# Patient Record
Sex: Female | Born: 1980 | Race: Black or African American | Hispanic: No | Marital: Single | Smoking: Current every day smoker
Health system: Southern US, Community
[De-identification: ages and names within clinical notes are randomized; demographics above are authoritative.]

## PROBLEM LIST (undated history)

## (undated) ENCOUNTER — Inpatient Hospital Stay (HOSPITAL_COMMUNITY): Payer: Self-pay

## (undated) DIAGNOSIS — Z789 Other specified health status: Secondary | ICD-10-CM

## (undated) HISTORY — PX: NO PAST SURGERIES: SHX2092

---

## 2003-05-17 ENCOUNTER — Other Ambulatory Visit: Admission: RE | Admit: 2003-05-17 | Discharge: 2003-05-17 | Payer: Self-pay | Admitting: Obstetrics and Gynecology

## 2003-05-17 ENCOUNTER — Other Ambulatory Visit: Admission: RE | Admit: 2003-05-17 | Discharge: 2003-05-17 | Payer: Self-pay | Admitting: *Deleted

## 2003-09-14 ENCOUNTER — Emergency Department (HOSPITAL_COMMUNITY): Admission: EM | Admit: 2003-09-14 | Discharge: 2003-09-14 | Payer: Self-pay | Admitting: Emergency Medicine

## 2004-12-11 ENCOUNTER — Emergency Department (HOSPITAL_COMMUNITY): Admission: EM | Admit: 2004-12-11 | Discharge: 2004-12-11 | Payer: Self-pay | Admitting: Emergency Medicine

## 2011-01-27 NOTE — L&D Delivery Note (Signed)
Delivery Note At 2:03 PM a viable female was delivered via  (Presentation: ;  ).  APGAR: , ; weight .   Placenta status: , .  Cord:  with the following complications: .  Cord pH: not done  Anesthesia:   Episiotomy:  Lacerations:  Suture Repair: 2.0 vicryl Est. Blood Loss (mL):   Mom to postpartum.  Baby to nursery-stable.  Felipe Cabell A 08/04/2011, 2:15 PM

## 2011-03-26 ENCOUNTER — Other Ambulatory Visit (HOSPITAL_COMMUNITY): Payer: Self-pay | Admitting: Obstetrics

## 2011-03-26 DIAGNOSIS — Z3689 Encounter for other specified antenatal screening: Secondary | ICD-10-CM

## 2011-03-31 ENCOUNTER — Encounter (HOSPITAL_COMMUNITY): Payer: Self-pay

## 2011-03-31 ENCOUNTER — Ambulatory Visit (HOSPITAL_COMMUNITY)
Admission: RE | Admit: 2011-03-31 | Discharge: 2011-03-31 | Disposition: A | Discharge status: Home / Self Care | Payer: Medicaid Other | Source: Ambulatory Visit | Attending: Obstetrics | Admitting: Obstetrics

## 2011-03-31 ENCOUNTER — Ambulatory Visit (HOSPITAL_COMMUNITY): Payer: Self-pay

## 2011-03-31 DIAGNOSIS — Z3689 Encounter for other specified antenatal screening: Secondary | ICD-10-CM

## 2011-03-31 DIAGNOSIS — O358XX0 Maternal care for other (suspected) fetal abnormality and damage, not applicable or unspecified: Secondary | ICD-10-CM | POA: Insufficient documentation

## 2011-03-31 DIAGNOSIS — Z363 Encounter for antenatal screening for malformations: Secondary | ICD-10-CM | POA: Insufficient documentation

## 2011-03-31 DIAGNOSIS — Z1389 Encounter for screening for other disorder: Secondary | ICD-10-CM | POA: Insufficient documentation

## 2011-04-02 ENCOUNTER — Other Ambulatory Visit (HOSPITAL_COMMUNITY): Payer: Self-pay | Admitting: Obstetrics

## 2011-04-02 DIAGNOSIS — Z3480 Encounter for supervision of other normal pregnancy, unspecified trimester: Secondary | ICD-10-CM

## 2011-04-14 ENCOUNTER — Ambulatory Visit (HOSPITAL_COMMUNITY)
Admission: RE | Admit: 2011-04-14 | Discharge: 2011-04-14 | Disposition: A | Discharge status: Home / Self Care | Payer: Medicaid Other | Source: Ambulatory Visit | Attending: Obstetrics | Admitting: Obstetrics

## 2011-04-14 DIAGNOSIS — Z3689 Encounter for other specified antenatal screening: Secondary | ICD-10-CM | POA: Insufficient documentation

## 2011-04-14 DIAGNOSIS — Z3480 Encounter for supervision of other normal pregnancy, unspecified trimester: Secondary | ICD-10-CM

## 2011-05-30 ENCOUNTER — Inpatient Hospital Stay (HOSPITAL_COMMUNITY)
Admission: AD | Admit: 2011-05-30 | Discharge: 2011-05-30 | Disposition: A | Discharge status: Home / Self Care | Payer: Medicaid Other | Source: Ambulatory Visit | Attending: Obstetrics | Admitting: Obstetrics

## 2011-05-30 ENCOUNTER — Encounter (HOSPITAL_COMMUNITY): Payer: Self-pay | Admitting: Obstetrics and Gynecology

## 2011-05-30 DIAGNOSIS — R109 Unspecified abdominal pain: Secondary | ICD-10-CM | POA: Insufficient documentation

## 2011-05-30 DIAGNOSIS — W1800XA Striking against unspecified object with subsequent fall, initial encounter: Secondary | ICD-10-CM

## 2011-05-30 DIAGNOSIS — O99891 Other specified diseases and conditions complicating pregnancy: Secondary | ICD-10-CM | POA: Insufficient documentation

## 2011-05-30 DIAGNOSIS — IMO0002 Reserved for concepts with insufficient information to code with codable children: Secondary | ICD-10-CM | POA: Insufficient documentation

## 2011-05-30 DIAGNOSIS — R1084 Generalized abdominal pain: Secondary | ICD-10-CM

## 2011-05-30 DIAGNOSIS — Y92009 Unspecified place in unspecified non-institutional (private) residence as the place of occurrence of the external cause: Secondary | ICD-10-CM | POA: Insufficient documentation

## 2011-05-30 HISTORY — DX: Other specified health status: Z78.9

## 2011-05-30 LAB — URINALYSIS, ROUTINE W REFLEX MICROSCOPIC
Bilirubin Urine: NEGATIVE
Ketones, ur: NEGATIVE mg/dL
Nitrite: NEGATIVE
Protein, ur: NEGATIVE mg/dL
Specific Gravity, Urine: 1.01 (ref 1.005–1.030)

## 2011-05-30 NOTE — ED Provider Notes (Signed)
First Provider Initiated Contact with Patient 05/30/11 1722      Chief Complaint:  Abdominal Pain and Fall Pt was letting her dog out last night, and the dog ran past her legs, causing her to hit her right side on the door.  She did not fall.  Her side was sore last night, less today, but "wanted to get it checked out"  Britny A Exton is  31 y.o. G1P0000 at [redacted]w[redacted]d presents complaining of Abdominal Pain and Fall .  She states none contractions are associated with none vaginal bleeding, intact membranes, along with active fetal movement.    Menstrual History: OB History    Grav Para Term Preterm Abortions TAB SAB Ect Mult Living   1 0 0 0 0 0 0 0 0 0        No LMP recorded. Patient is pregnant.     Past Medical History: Past Medical History  Diagnosis Date  . No pertinent past medical history     Past Surgical History: Past Surgical History  Procedure Date  . No past surgeries     Family History: Family History  Problem Relation Age of Onset  . Diabetes Father     Social History: History  Substance Use Topics  . Smoking status: Never Smoker   . Smokeless tobacco: Not on file  . Alcohol Use: No    Allergies: No Known Allergies  Meds:  Prescriptions prior to admission  Medication Sig Dispense Refill  . Prenatal Vit-Fe Fumarate-FA (PRENATAL MULTIVITAMIN) TABS Take 1 tablet by mouth daily.        Review of Systems - Please refer to the aforementioned patients' reports.     Physical Exam  Blood pressure 146/81, pulse 90, temperature 98.2 F (36.8 C), temperature source Oral, resp. rate 18. GENERAL: Well-developed, well-nourished female in no acute distress.  LUNGS: Clear to auscultation bilaterally.  HEART: Regular rate and rhythm. ABDOMEN: Soft, nontender, nondistended, gravid.   Area of soreness is on the right lateral side, at the level of the umbilicus EXTREMITIES: Nontender, no edema, 2+ distal pulses. CERVICAL EXAMdeferred FHT:  Baseline rate 150  bpm   Variability moderate  Accelerations present   Decelerations none Contractions: Every 0 mins   Labs: Results for orders placed during the hospital encounter of 05/30/11 (from the past 24 hour(s))  URINALYSIS, ROUTINE W REFLEX MICROSCOPIC     Status: Abnormal   Collection Time   05/30/11  5:37 PM      Component Value Range   Color, Urine YELLOW  YELLOW    APPearance HAZY (*) CLEAR    Specific Gravity, Urine 1.010  1.005 - 1.030    pH 7.5  5.0 - 8.0    Glucose, UA NEGATIVE  NEGATIVE (mg/dL)   Hgb urine dipstick NEGATIVE  NEGATIVE    Bilirubin Urine NEGATIVE  NEGATIVE    Ketones, ur NEGATIVE  NEGATIVE (mg/dL)   Protein, ur NEGATIVE  NEGATIVE (mg/dL)   Urobilinogen, UA 1.0  0.0 - 1.0 (mg/dL)   Nitrite NEGATIVE  NEGATIVE    Leukocytes, UA NEGATIVE  NEGATIVE     Imaging Studies:  No results found.  Assessment: Alphia A Ulysse is  31 y.o. G1P0000 at [redacted]w[redacted]d presents with abdominal soreness s/p hitting it on the side of a door;  Elevated B/P Plan: Dr. Gaynell Face given report, including vital signs, and orders to discharge pt.  CRESENZO-DISHMAN,Elchanan Bob 5/4/20135:42 PM

## 2011-05-30 NOTE — MAU Note (Signed)
Patient presents with c/o right side abdominal pain after taking a fall last night. Patient is [redacted] weeks gestation.

## 2011-05-30 NOTE — Discharge Instructions (Signed)
Fetal Movement Counts Patient Name: __________________________________________________ Patient Due Date: ____________________ Kick counts is highly recommended in high risk pregnancies, but it is a good idea for every pregnant woman to do. Start counting fetal movements at 28 weeks of the pregnancy. Fetal movements increase after eating a full meal or eating or drinking something sweet (the blood sugar is higher). It is also important to drink plenty of fluids (well hydrated) before doing the count. Lie on your left side because it helps with the circulation or you can sit in a comfortable chair with your arms over your belly (abdomen) with no distractions around you. DOING THE COUNT  Try to do the count the same time of day each time you do it.   Mark the day and time, then see how long it takes for you to feel 10 movements (kicks, flutters, swishes, rolls). You should have at least 10 movements within 2 hours. You will most likely feel 10 movements in much less than 2 hours. If you do not, wait an hour and count again. After a couple of days you will see a pattern.   What you are looking for is a change in the pattern or not enough counts in 2 hours. Is it taking longer in time to reach 10 movements?  SEEK MEDICAL CARE IF:  You feel less than 10 counts in 2 hours. Tried twice.   No movement in one hour.   The pattern is changing or taking longer each day to reach 10 counts in 2 hours.   You feel the baby is not moving as it usually does.  Date: ____________ Movements: ____________ Start time: ____________ Finish time: ____________  Date: ____________ Movements: ____________ Start time: ____________ Finish time: ____________ Date: ____________ Movements: ____________ Start time: ____________ Finish time: ____________ Date: ____________ Movements: ____________ Start time: ____________ Finish time: ____________ Date: ____________ Movements: ____________ Start time: ____________ Finish time:  ____________ Date: ____________ Movements: ____________ Start time: ____________ Finish time: ____________ Date: ____________ Movements: ____________ Start time: ____________ Finish time: ____________ Date: ____________ Movements: ____________ Start time: ____________ Finish time: ____________  Date: ____________ Movements: ____________ Start time: ____________ Finish time: ____________ Date: ____________ Movements: ____________ Start time: ____________ Finish time: ____________ Date: ____________ Movements: ____________ Start time: ____________ Finish time: ____________ Date: ____________ Movements: ____________ Start time: ____________ Finish time: ____________ Date: ____________ Movements: ____________ Start time: ____________ Finish time: ____________ Date: ____________ Movements: ____________ Start time: ____________ Finish time: ____________ Date: ____________ Movements: ____________ Start time: ____________ Finish time: ____________  Date: ____________ Movements: ____________ Start time: ____________ Finish time: ____________ Date: ____________ Movements: ____________ Start time: ____________ Finish time: ____________ Date: ____________ Movements: ____________ Start time: ____________ Finish time: ____________ Date: ____________ Movements: ____________ Start time: ____________ Finish time: ____________ Date: ____________ Movements: ____________ Start time: ____________ Finish time: ____________ Date: ____________ Movements: ____________ Start time: ____________ Finish time: ____________ Date: ____________ Movements: ____________ Start time: ____________ Finish time: ____________  Date: ____________ Movements: ____________ Start time: ____________ Finish time: ____________ Date: ____________ Movements: ____________ Start time: ____________ Finish time: ____________ Date: ____________ Movements: ____________ Start time: ____________ Finish time: ____________ Date: ____________ Movements:  ____________ Start time: ____________ Finish time: ____________ Date: ____________ Movements: ____________ Start time: ____________ Finish time: ____________ Date: ____________ Movements: ____________ Start time: ____________ Finish time: ____________ Date: ____________ Movements: ____________ Start time: ____________ Finish time: ____________  Date: ____________ Movements: ____________ Start time: ____________ Finish time: ____________ Date: ____________ Movements: ____________ Start time: ____________ Finish time: ____________ Date: ____________ Movements: ____________ Start time:   ____________ Finish time: ____________ Date: ____________ Movements: ____________ Start time: ____________ Finish time: ____________ Date: ____________ Movements: ____________ Start time: ____________ Finish time: ____________ Date: ____________ Movements: ____________ Start time: ____________ Finish time: ____________ Date: ____________ Movements: ____________ Start time: ____________ Finish time: ____________  Date: ____________ Movements: ____________ Start time: ____________ Finish time: ____________ Date: ____________ Movements: ____________ Start time: ____________ Finish time: ____________ Date: ____________ Movements: ____________ Start time: ____________ Finish time: ____________ Date: ____________ Movements: ____________ Start time: ____________ Finish time: ____________ Date: ____________ Movements: ____________ Start time: ____________ Finish time: ____________ Date: ____________ Movements: ____________ Start time: ____________ Finish time: ____________ Date: ____________ Movements: ____________ Start time: ____________ Finish time: ____________  Date: ____________ Movements: ____________ Start time: ____________ Finish time: ____________ Date: ____________ Movements: ____________ Start time: ____________ Finish time: ____________ Date: ____________ Movements: ____________ Start time: ____________ Finish  time: ____________ Date: ____________ Movements: ____________ Start time: ____________ Finish time: ____________ Date: ____________ Movements: ____________ Start time: ____________ Finish time: ____________ Date: ____________ Movements: ____________ Start time: ____________ Finish time: ____________ Date: ____________ Movements: ____________ Start time: ____________ Finish time: ____________  Date: ____________ Movements: ____________ Start time: ____________ Finish time: ____________ Date: ____________ Movements: ____________ Start time: ____________ Finish time: ____________ Date: ____________ Movements: ____________ Start time: ____________ Finish time: ____________ Date: ____________ Movements: ____________ Start time: ____________ Finish time: ____________ Date: ____________ Movements: ____________ Start time: ____________ Finish time: ____________ Date: ____________ Movements: ____________ Start time: ____________ Finish time: ____________ Document Released: 02/11/2006 Document Revised: 01/01/2011 Document Reviewed: 08/14/2008 [employer] Patient Information 2012 [employer]. 

## 2011-08-03 ENCOUNTER — Encounter (HOSPITAL_COMMUNITY): Payer: Self-pay | Admitting: *Deleted

## 2011-08-03 ENCOUNTER — Inpatient Hospital Stay (HOSPITAL_COMMUNITY)
Admission: AD | Admit: 2011-08-03 | Discharge: 2011-08-06 | DRG: 775 | Disposition: A | Discharge status: Home / Self Care | Payer: Medicaid Other | Source: Ambulatory Visit | Attending: Obstetrics | Admitting: Obstetrics

## 2011-08-03 NOTE — MAU Note (Signed)
PT SAY AT 945PM SHE FELT A POP-  WENT TO B-ROOM - THINKS SROM- CLEAR FLUID.   DR MARSHALL- OFFICE LAST WEEK- CLOSED.

## 2011-08-03 NOTE — MAU Note (Signed)
UC STARTED AT 10PM

## 2011-08-04 ENCOUNTER — Encounter (HOSPITAL_COMMUNITY): Payer: Self-pay | Admitting: Anesthesiology

## 2011-08-04 ENCOUNTER — Encounter (HOSPITAL_COMMUNITY): Payer: Self-pay | Admitting: *Deleted

## 2011-08-04 ENCOUNTER — Inpatient Hospital Stay (HOSPITAL_COMMUNITY): Payer: Medicaid Other | Admitting: Anesthesiology

## 2011-08-04 LAB — OB RESULTS CONSOLE HEPATITIS B SURFACE ANTIGEN: Hepatitis B Surface Ag: NEGATIVE

## 2011-08-04 LAB — CBC
MCH: 32.5 pg (ref 26.0–34.0)
MCHC: 34.1 g/dL (ref 30.0–36.0)
Platelets: 399 10*3/uL (ref 150–400)
WBC: 9.6 10*3/uL (ref 4.0–10.5)

## 2011-08-04 LAB — OB RESULTS CONSOLE HIV ANTIBODY (ROUTINE TESTING): HIV: NONREACTIVE

## 2011-08-04 LAB — OB RESULTS CONSOLE GC/CHLAMYDIA: Chlamydia: NEGATIVE

## 2011-08-04 LAB — OB RESULTS CONSOLE RPR: RPR: NONREACTIVE

## 2011-08-04 MED ORDER — ONDANSETRON HCL 4 MG/2ML IJ SOLN: 4.0000 mg | INTRAMUSCULAR | Status: DC | PRN

## 2011-08-04 MED ORDER — LANOLIN HYDROUS EX OINT: TOPICAL_OINTMENT | CUTANEOUS | Status: DC | PRN

## 2011-08-04 MED ORDER — BENZOCAINE-MENTHOL 20-0.5 % EX AERO
1.0000 "application " | INHALATION_SPRAY | CUTANEOUS | Status: DC | PRN
  Administered 2011-08-04: 1 via TOPICAL
  Filled 2011-08-04: qty 56

## 2011-08-04 MED ORDER — LACTATED RINGERS IV SOLN: 500.0000 mL | INTRAVENOUS | Status: DC | PRN

## 2011-08-04 MED ORDER — ONDANSETRON HCL 4 MG PO TABS: 4.0000 mg | ORAL_TABLET | ORAL | Status: DC | PRN

## 2011-08-04 MED ORDER — IBUPROFEN 600 MG PO TABS
600.0000 mg | ORAL_TABLET | Freq: Four times a day (QID) | ORAL | Status: DC | PRN
  Administered 2011-08-04: 600 mg via ORAL
  Filled 2011-08-04 (×6): qty 1

## 2011-08-04 MED ORDER — ACETAMINOPHEN 325 MG PO TABS: 650.0000 mg | ORAL_TABLET | ORAL | Status: DC | PRN

## 2011-08-04 MED ORDER — LACTATED RINGERS IV SOLN
INTRAVENOUS | Status: DC
  Administered 2011-08-04 (×3): via INTRAVENOUS

## 2011-08-04 MED ORDER — LACTATED RINGERS IV SOLN: 500.0000 mL | Freq: Once | INTRAVENOUS | Status: DC

## 2011-08-04 MED ORDER — EPHEDRINE 5 MG/ML INJ
10.0000 mg | INTRAVENOUS | Status: DC | PRN
  Filled 2011-08-04: qty 2
  Filled 2011-08-04: qty 4

## 2011-08-04 MED ORDER — PRENATAL MULTIVITAMIN CH
1.0000 | ORAL_TABLET | Freq: Every day | ORAL | Status: DC
  Administered 2011-08-05 – 2011-08-06 (×2): 1 via ORAL
  Filled 2011-08-04 (×2): qty 1

## 2011-08-04 MED ORDER — TERBUTALINE SULFATE 1 MG/ML IJ SOLN
INTRAMUSCULAR | Status: AC
  Filled 2011-08-04: qty 1

## 2011-08-04 MED ORDER — EPHEDRINE 5 MG/ML INJ
10.0000 mg | INTRAVENOUS | Status: DC | PRN
  Filled 2011-08-04: qty 2

## 2011-08-04 MED ORDER — WITCH HAZEL-GLYCERIN EX PADS: 1.0000 "application " | MEDICATED_PAD | CUTANEOUS | Status: DC | PRN

## 2011-08-04 MED ORDER — PHENYLEPHRINE 40 MCG/ML (10ML) SYRINGE FOR IV PUSH (FOR BLOOD PRESSURE SUPPORT)
80.0000 ug | PREFILLED_SYRINGE | INTRAVENOUS | Status: DC | PRN
  Filled 2011-08-04: qty 2

## 2011-08-04 MED ORDER — IBUPROFEN 600 MG PO TABS
600.0000 mg | ORAL_TABLET | Freq: Four times a day (QID) | ORAL | Status: DC
  Administered 2011-08-05 – 2011-08-06 (×7): 600 mg via ORAL
  Filled 2011-08-04 (×2): qty 1

## 2011-08-04 MED ORDER — OXYCODONE-ACETAMINOPHEN 5-325 MG PO TABS
1.0000 | ORAL_TABLET | ORAL | Status: DC | PRN
  Administered 2011-08-05 (×2): 1 via ORAL
  Filled 2011-08-04 (×2): qty 2

## 2011-08-04 MED ORDER — LACTATED RINGERS IV SOLN
INTRAVENOUS | Status: DC
  Administered 2011-08-04: 10:00:00 via INTRAUTERINE

## 2011-08-04 MED ORDER — LIDOCAINE HCL (PF) 1 % IJ SOLN
30.0000 mL | INTRAMUSCULAR | Status: DC | PRN
  Filled 2011-08-04 (×2): qty 30

## 2011-08-04 MED ORDER — DIPHENHYDRAMINE HCL 50 MG/ML IJ SOLN: 12.5000 mg | INTRAMUSCULAR | Status: DC | PRN

## 2011-08-04 MED ORDER — DIPHENHYDRAMINE HCL 25 MG PO CAPS: 25.0000 mg | ORAL_CAPSULE | Freq: Four times a day (QID) | ORAL | Status: DC | PRN

## 2011-08-04 MED ORDER — FLEET ENEMA 7-19 GM/118ML RE ENEM: 1.0000 | ENEMA | RECTAL | Status: DC | PRN

## 2011-08-04 MED ORDER — CITRIC ACID-SODIUM CITRATE 334-500 MG/5ML PO SOLN
30.0000 mL | ORAL | Status: DC | PRN
  Filled 2011-08-04: qty 15

## 2011-08-04 MED ORDER — SENNOSIDES-DOCUSATE SODIUM 8.6-50 MG PO TABS
2.0000 | ORAL_TABLET | Freq: Every day | ORAL | Status: DC
  Administered 2011-08-04 – 2011-08-05 (×2): 2 via ORAL

## 2011-08-04 MED ORDER — DIBUCAINE 1 % RE OINT: 1.0000 "application " | TOPICAL_OINTMENT | RECTAL | Status: DC | PRN

## 2011-08-04 MED ORDER — OXYCODONE-ACETAMINOPHEN 5-325 MG PO TABS
1.0000 | ORAL_TABLET | ORAL | Status: DC | PRN
  Administered 2011-08-04 – 2011-08-05 (×4): 2 via ORAL
  Administered 2011-08-06: 1 via ORAL
  Administered 2011-08-06: 2 via ORAL
  Filled 2011-08-04: qty 1
  Filled 2011-08-04 (×2): qty 2
  Filled 2011-08-04: qty 1
  Filled 2011-08-04: qty 2
  Filled 2011-08-04: qty 1

## 2011-08-04 MED ORDER — OXYTOCIN 40 UNITS IN LACTATED RINGERS INFUSION - SIMPLE MED
62.5000 mL/h | Freq: Once | INTRAVENOUS | Status: DC
  Filled 2011-08-04: qty 1000

## 2011-08-04 MED ORDER — ZOLPIDEM TARTRATE 5 MG PO TABS: 5.0000 mg | ORAL_TABLET | Freq: Every evening | ORAL | Status: DC | PRN

## 2011-08-04 MED ORDER — LIDOCAINE HCL (PF) 1 % IJ SOLN
INTRAMUSCULAR | Status: DC | PRN
  Administered 2011-08-04: 30 mL via VAGINAL
  Administered 2011-08-04 (×2): 5 mL

## 2011-08-04 MED ORDER — FERROUS SULFATE 325 (65 FE) MG PO TABS
325.0000 mg | ORAL_TABLET | Freq: Two times a day (BID) | ORAL | Status: DC
  Administered 2011-08-05 – 2011-08-06 (×3): 325 mg via ORAL
  Filled 2011-08-04 (×3): qty 1

## 2011-08-04 MED ORDER — TETANUS-DIPHTH-ACELL PERTUSSIS 5-2.5-18.5 LF-MCG/0.5 IM SUSP
0.5000 mL | Freq: Once | INTRAMUSCULAR | Status: AC
  Administered 2011-08-05: 0.5 mL via INTRAMUSCULAR

## 2011-08-04 MED ORDER — OXYTOCIN BOLUS FROM INFUSION
250.0000 mL | Freq: Once | INTRAVENOUS | Status: DC
  Filled 2011-08-04: qty 500

## 2011-08-04 MED ORDER — SIMETHICONE 80 MG PO CHEW: 80.0000 mg | CHEWABLE_TABLET | ORAL | Status: DC | PRN

## 2011-08-04 MED ORDER — ONDANSETRON HCL 4 MG/2ML IJ SOLN: 4.0000 mg | Freq: Four times a day (QID) | INTRAMUSCULAR | Status: DC | PRN

## 2011-08-04 MED ORDER — PHENYLEPHRINE 40 MCG/ML (10ML) SYRINGE FOR IV PUSH (FOR BLOOD PRESSURE SUPPORT)
80.0000 ug | PREFILLED_SYRINGE | INTRAVENOUS | Status: DC | PRN
  Filled 2011-08-04: qty 5
  Filled 2011-08-04: qty 2

## 2011-08-04 MED ORDER — FENTANYL 2.5 MCG/ML BUPIVACAINE 1/10 % EPIDURAL INFUSION (WH - ANES)
14.0000 mL/h | INTRAMUSCULAR | Status: DC
  Administered 2011-08-04 (×4): 14 mL/h via EPIDURAL
  Filled 2011-08-04 (×4): qty 60

## 2011-08-04 NOTE — Anesthesia Preprocedure Evaluation (Signed)
Anesthesia Evaluation  Patient identified by MRN, date of birth, ID band Patient awake    Reviewed: Allergy & Precautions, H&P , Patient's Chart, lab work & pertinent test results  Airway Mallampati: III TM Distance: >3 FB Neck ROM: full    Dental No notable dental hx.    Pulmonary neg pulmonary ROS,  breath sounds clear to auscultation  Pulmonary exam normal       Cardiovascular negative cardio ROS  Rhythm:regular Rate:Normal     Neuro/Psych negative neurological ROS  negative psych ROS   GI/Hepatic negative GI ROS, Neg liver ROS,   Endo/Other  negative endocrine ROS  Renal/GU negative Renal ROS     Musculoskeletal   Abdominal   Peds  Hematology negative hematology ROS (+)   Anesthesia Other Findings   Reproductive/Obstetrics (+) Pregnancy                           Anesthesia Physical Anesthesia Plan  ASA: II  Anesthesia Plan: Epidural   Post-op Pain Management:    Induction:   Airway Management Planned:   Additional Equipment:   Intra-op Plan:   Post-operative Plan:   Informed Consent: I have reviewed the patients History and Physical, chart, labs and discussed the procedure including the risks, benefits and alternatives for the proposed anesthesia with the patient or authorized representative who has indicated his/her understanding and acceptance.     Plan Discussed with:   Anesthesia Plan Comments:         Anesthesia Quick Evaluation  

## 2011-08-04 NOTE — H&P (Signed)
Alicia Perry is a 31 y.o. female presenting for SROM and UC's.. Maternal Medical History:  Reason for admission: Reason for admission: rupture of membranes and contractions.  Contractions: Onset was 6-12 hours ago.   Frequency: regular.   Perceived severity is strong.    Fetal activity: Perceived fetal activity is normal.   Last perceived fetal movement was within the past hour.    Prenatal complications: no prenatal complications   OB History    Grav Para Term Preterm Abortions TAB SAB Ect Mult Living   2 0 0 0 1 0 0 0 0 0      Past Medical History  Diagnosis Date  . No pertinent past medical history    Past Surgical History  Procedure Date  . No past surgeries    Family History: family history includes Diabetes in her father. Social History:  reports that she has never smoked. She does not have any smokeless tobacco history on file. She reports that she does not drink alcohol or use illicit drugs.   Prenatal Transfer Tool  Maternal Diabetes: No Genetic Screening: Normal Maternal Ultrasounds/Referrals: Normal Fetal Ultrasounds or other Referrals:  None Maternal Substance Abuse:  No Significant Maternal Medications:  Meds include: Other: see prenatal record Significant Maternal Lab Results:  Lab values include: Group B Strep negative Other Comments:  None  Review of Systems  All other systems reviewed and are negative.    Dilation: 3.5 Effacement (%): 80 Station: -2 Exam by:: Penley, RN  Blood pressure 147/83, pulse 88, temperature 98.1 F (36.7 C), temperature source Oral, resp. rate 18, height 5\' 4"  (1.626 m), weight 104.101 kg (229 lb 8 oz), SpO2 100.00%. Maternal Exam:  Uterine Assessment: Contraction strength is firm.  Contraction frequency is regular.   Abdomen: Patient reports no abdominal tenderness. Fetal presentation: vertex  Introitus: Normal vulva. Normal vagina.  Pelvis: adequate for delivery.   Cervix: Cervix evaluated by digital exam.      Physical Exam  Nursing note and vitals reviewed. Constitutional: She is oriented to person, place, and time. She appears well-developed and well-nourished.  HENT:  Head: Normocephalic and atraumatic.  Eyes: Conjunctivae are normal. Pupils are equal, round, and reactive to light.  Neck: Normal range of motion. Neck supple.  Cardiovascular: Normal rate and regular rhythm.   Respiratory: Effort normal.  GI: Soft.  Genitourinary: Vagina normal and uterus normal.  Musculoskeletal: Normal range of motion.  Neurological: She is alert and oriented to person, place, and time.  Skin: Skin is warm and dry.  Psychiatric: She has a normal mood and affect. Her behavior is normal. Judgment and thought content normal.    Prenatal labs: ABO, Rh: B/Positive/-- (07/09 0000) Antibody: Negative (07/09 0000) Rubella: Immune (07/09 0000) RPR: Nonreactive (07/09 0000)  HBsAg: Negative (07/09 0000)  HIV: Non-reactive (07/09 0000)  GBS: Negative (07/09 0000)   Assessment/Plan: 31 yo G2 P0  EDC 08-11-11.  Early labor.  Meconium.  Occiput posterior.  Start amnioinfusion.  Pitocin prn.   HARPER,CHARLES A 08/04/2011, 5:39 AM

## 2011-08-04 NOTE — Anesthesia Procedure Notes (Signed)
Epidural Patient location during procedure: OB Start time: 08/04/2011 1:19 AM  Staffing Anesthesiologist: Brayton Caves R Performed by: anesthesiologist   Preanesthetic Checklist Completed: patient identified, site marked, surgical consent, pre-op evaluation, timeout performed, IV checked, risks and benefits discussed and monitors and equipment checked  Epidural Patient position: sitting Prep: site prepped and draped and DuraPrep Patient monitoring: continuous pulse ox and blood pressure Approach: midline Injection technique: LOR air and LOR saline  Needle:  Needle type: Tuohy  Needle gauge: 17 G Needle length: 9 cm Needle insertion depth: 6 cm Catheter type: closed end flexible Catheter size: 19 Gauge Catheter at skin depth: 11 cm Test dose: negative  Assessment Events: blood not aspirated, injection not painful, no injection resistance, negative IV test and no paresthesia  Additional Notes Patient identified.  Risk benefits discussed including failed block, incomplete pain control, headache, nerve damage, paralysis, blood pressure changes, nausea, vomiting, reactions to medication both toxic or allergic, and postpartum back pain.  Patient expressed understanding and wished to proceed.  All questions were answered.  Sterile technique used throughout procedure and epidural site dressed with sterile barrier dressing. No paresthesia or other complications noted.The patient did not experience any signs of intravascular injection such as tinnitus or metallic taste in mouth nor signs of intrathecal spread such as rapid motor block. Please see nursing notes for vital signs.

## 2011-08-04 NOTE — Progress Notes (Signed)
Dianelly A Jocelyn is a 31 y.o. G2P0010 at [redacted]w[redacted]d by LMP admitted for rupture of membranes  Subjective:   Objective: BP 147/83  Pulse 88  Temp 98.1 F (36.7 C) (Oral)  Resp 18  Ht 5\' 4"  (1.626 m)  Wt 104.101 kg (229 lb 8 oz)  BMI 39.39 kg/m2  SpO2 100%   Total I/O In: -  Out: 650 [Urine:650]  FHT:  FHR: 150 bpm, variability: moderate,  accelerations:  Present,  decelerations:  Present variables UC:   regular, every 2-4 minutes SVE:   Dilation: 3.5 Effacement (%): 80 Station: -2 Exam by:: [employer], RN   Labs: Lab Results  Component Value Date   WBC 9.6 08/03/2011   HGB 12.3 08/03/2011   HCT 36.1 08/03/2011   MCV 95.3 08/03/2011   PLT 399 08/03/2011    Assessment / Plan: Spontaneous labor, progressing normally  Labor: Progressing normally Preeclampsia:  n/a Fetal Wellbeing:  Category I Pain Control:  Epidural I/D:  n/a Anticipated MOD:  NSVD  Ariauna Farabee A 08/04/2011, 5:48 AM

## 2011-08-04 NOTE — MAU Provider Note (Signed)
S: 31 y.o.G1P0000 @[redacted]w[redacted]d  presents to MAU with LOF and contractions.  O: BP 142/74  Pulse 98  Temp 98.7 F (37.1 C) (Oral)  Resp 20  Ht 5\' 4"  (1.626 m)  Wt 104.101 kg (229 lb 8 oz)  BMI 39.39 kg/m2  Pelvic exam: Pooling of clear fluid mixed with bright red blood in vagina Dilation: 2 Effacement (%): 80 Station: -2 Presentation: Vertex Exam by:: Vivien Presto CNM  Heavy meconium on glove and leaking onto bed under pt following cervical exam  A: SROM Early labor  P: RN to call Dr Clearance Coots Admission to birthing suites  LEFTWICH-KIRBY, Davinity Fanara Certified Nurse-Midwife

## 2011-08-05 ENCOUNTER — Encounter (HOSPITAL_COMMUNITY): Payer: Self-pay | Admitting: *Deleted

## 2011-08-05 LAB — CBC
HCT: 30 % — ABNORMAL LOW (ref 36.0–46.0)
MCHC: 35 g/dL (ref 30.0–36.0)
MCV: 96.8 fL (ref 78.0–100.0)
RDW: 12.4 % (ref 11.5–15.5)
WBC: 18.6 10*3/uL — ABNORMAL HIGH (ref 4.0–10.5)

## 2011-08-05 NOTE — Progress Notes (Signed)
Patient ID: Alicia Perry, female   DOB: Jun 01, 1980, 31 y.o.   MRN: [id number] Postpartum day one Vital signs normal Fundus firm Lochia moderate Legs negative No complaints

## 2011-08-05 NOTE — Progress Notes (Signed)
UR chart review completed.  

## 2011-08-06 NOTE — Discharge Summary (Signed)
Obstetric Discharge Summary Reason for Admission: onset of labor Prenatal Procedures: none Intrapartum Procedures: spontaneous vaginal delivery Postpartum Procedures: none Complications-Operative and Postpartum: none Hemoglobin  Date Value Range Status  08/05/2011 10.5* 12.0 - 15.0 g/dL Final     HCT  Date Value Range Status  08/05/2011 30.0* 36.0 - 46.0 % Final    Physical Exam:  General: alert Lochia: appropriate Uterine Fundus: firm Incision: healing well DVT Evaluation: No evidence of DVT seen on physical exam.  Discharge Diagnoses: Term Pregnancy-delivered  Discharge Information: Date: 08/06/2011 Activity: pelvic rest Diet: routine Medications: Percocet Condition: stable Instructions: refer to practice specific booklet Discharge to: home Follow-up Information    Follow up with Jodee Wagenaar A, MD. Schedule an appointment as soon as possible for a visit in 6 weeks.   Contact information:   [address] [place] [address] [phone]          Newborn Data: Live born female  Birth Weight: 6 lb 11.2 oz (3040 g) APGAR: 6, 8  Home with mother.  Asa Fath A 08/06/2011, 6:44 AM

## 2011-08-07 NOTE — Anesthesia Postprocedure Evaluation (Signed)
Anesthesia Post Note  Patient: Alicia Perry  Procedure(s) Performed: * No procedures listed *  Anesthesia type: Epidural  Patient location: Mother/Baby  Post pain: Pain level controlled  Post assessment: Post-op Vital signs reviewed  Last Vitals: There were no vitals filed for this visit.  Post vital signs: Reviewed  Level of consciousness: awake  Complications: No apparent anesthesia complications

## 2012-07-13 ENCOUNTER — Other Ambulatory Visit: Payer: Self-pay

## 2012-07-13 ENCOUNTER — Encounter (HOSPITAL_COMMUNITY): Payer: Self-pay

## 2012-07-13 ENCOUNTER — Emergency Department (INDEPENDENT_AMBULATORY_CARE_PROVIDER_SITE_OTHER)
Admission: EM | Admit: 2012-07-13 | Discharge: 2012-07-13 | Disposition: A | Discharge status: Home / Self Care | Payer: Self-pay | Source: Home / Self Care

## 2012-07-13 DIAGNOSIS — M940 Chondrocostal junction syndrome [Tietze]: Secondary | ICD-10-CM

## 2012-07-13 MED ORDER — HYDROCOD POLST-CHLORPHEN POLST 10-8 MG/5ML PO LQCR: 5.0000 mL | Freq: Two times a day (BID) | ORAL | Status: DC | PRN

## 2012-07-13 MED ORDER — DICLOFENAC POTASSIUM 50 MG PO TABS: 50.0000 mg | ORAL_TABLET | Freq: Three times a day (TID) | ORAL | Status: DC

## 2012-07-13 NOTE — ED Provider Notes (Signed)
History     CSN: [id number]  Arrival date & time 07/13/12  1847   None     Chief Complaint  Patient presents with  . Chest Pain    (Consider location/radiation/quality/duration/timing/severity/associated sxs/prior treatment) Patient is a 32 y.o. female presenting with chest pain. The history is provided by the patient.  Chest Pain Pain location:  L chest Pain quality: sharp   Pain radiates to:  Upper back Pain radiates to the back: yes   Pain severity:  Mild Onset quality:  Gradual Duration:  1 week Timing:  Constant Progression:  Waxing and waning Chronicity:  New Context: movement   Associated symptoms: cough   Associated symptoms: no palpitations and no shortness of breath   Risk factors: smoking     Past Medical History  Diagnosis Date  . No pertinent past medical history     Past Surgical History  Procedure Laterality Date  . No past surgeries      Family History  Problem Relation Age of Onset  . Diabetes Father     History  Substance Use Topics  . Smoking status: Never Smoker   . Smokeless tobacco: Not on file  . Alcohol Use: No    OB History   Grav Para Term Preterm Abortions TAB SAB Ect Mult Living   2 1 1  0 1 0 0 0 0 1      Review of Systems  Constitutional: Negative.   Respiratory: Positive for cough. Negative for chest tightness, shortness of breath and wheezing.   Cardiovascular: Positive for chest pain. Negative for palpitations and leg swelling.  Gastrointestinal: Negative.     Allergies  Review of patient's allergies indicates no known allergies.  Home Medications   Current Outpatient Rx  Name  Route  Sig  Dispense  Refill  . chlorpheniramine-HYDROcodone (TUSSIONEX PENNKINETIC ER) 10-8 MG/5ML LQCR   Oral   Take 5 mLs by mouth every 12 (twelve) hours as needed.   115 mL   0   . diclofenac (CATAFLAM) 50 MG tablet   Oral   Take 1 tablet (50 mg total) by mouth 3 (three) times daily.   30 tablet   0     BP 147/97   Pulse 103  Temp(Src) 98.1 F (36.7 C) (Oral)  Resp 16  SpO2 100%  LMP 06/20/2012  Physical Exam  Nursing note and vitals reviewed. Constitutional: She is oriented to person, place, and time. She appears well-developed and well-nourished. No distress.  Neck: Normal range of motion. Neck supple.  Cardiovascular: Normal rate, regular rhythm, normal heart sounds and intact distal pulses.   Pulmonary/Chest: Effort normal and breath sounds normal. She exhibits tenderness.    Abdominal: Soft. Bowel sounds are normal.  Lymphadenopathy:    She has no cervical adenopathy.  Neurological: She is alert and oriented to person, place, and time.  Skin: Skin is warm and dry.    ED Course  Procedures (including critical care time)  Labs Reviewed - No data to display No results found.   1. Acute costochondritis       MDM  [id number]--wnl.        Linna Hoff, MD 07/13/12 [phone]

## 2012-07-13 NOTE — ED Notes (Signed)
States he has been having pain in left and mid chest through to shoulder  for 1 week, used motrin Saturday w some relief. States no change in pain w deep breathing or ROM . Recent cough. NAD at present

## 2012-12-16 ENCOUNTER — Emergency Department (INDEPENDENT_AMBULATORY_CARE_PROVIDER_SITE_OTHER)
Admission: EM | Admit: 2012-12-16 | Discharge: 2012-12-16 | Disposition: A | Discharge status: Home / Self Care | Payer: Self-pay | Source: Home / Self Care | Attending: Family Medicine | Admitting: Family Medicine

## 2012-12-16 ENCOUNTER — Encounter (HOSPITAL_COMMUNITY): Payer: Self-pay | Admitting: Emergency Medicine

## 2012-12-16 DIAGNOSIS — J069 Acute upper respiratory infection, unspecified: Secondary | ICD-10-CM

## 2012-12-16 DIAGNOSIS — J00 Acute nasopharyngitis [common cold]: Secondary | ICD-10-CM

## 2012-12-16 MED ORDER — METHYLPREDNISOLONE 4 MG PO KIT: PACK | ORAL | Status: DC

## 2012-12-16 MED ORDER — ALBUTEROL SULFATE HFA 108 (90 BASE) MCG/ACT IN AERS: 2.0000 | INHALATION_SPRAY | RESPIRATORY_TRACT | Status: DC | PRN

## 2012-12-16 MED ORDER — CHLORPHENIRAMINE-PSE-IBUPROFEN 2-30-200 MG PO TABS: ORAL_TABLET | ORAL | Status: DC

## 2012-12-16 NOTE — ED Provider Notes (Signed)
Medical screening examination/treatment/procedure(s) were performed by resident physician or non-physician practitioner and as supervising physician I was immediately available for consultation/collaboration.   Alleyne Lac DOUGLAS MD.   Jimia Gentles D Shandell Jallow, MD 12/16/12 1701 

## 2012-12-16 NOTE — ED Notes (Signed)
Pt c/o sore throat onset yest Sxs include: productive cough, bilateral ear pain, wheezing Denies: SOB, f/v/n/d Alert w/no signs of acute distress.

## 2012-12-16 NOTE — ED Provider Notes (Signed)
CSN: [id number]     Arrival date & time 12/16/12  0803 History   First MD Initiated Contact with Patient 12/16/12 [phone]     Chief Complaint  Patient presents with  . Sore Throat   (Consider location/radiation/quality/duration/timing/severity/associated sxs/prior Treatment) HPI 32 year old female presents complaining of sore throat, bilateral ear pain, cough, wheezing. This is been going on for approximately one week. For one week, she has only had mild cough has been nonproductive. In the past 3 days she started to have some subjective wheezing. The sore throat started yesterday. She has not tried taking any medicine over-the-counter for treatment. She denies fever, chills, shortness of breath, recent travel, sick contacts.  Past Medical History  Diagnosis Date  . No pertinent past medical history    Past Surgical History  Procedure Laterality Date  . No past surgeries     Family History  Problem Relation Age of Onset  . Diabetes Father    History  Substance Use Topics  . Smoking status: Never Smoker   . Smokeless tobacco: Not on file  . Alcohol Use: No   OB History   Grav Para Term Preterm Abortions TAB SAB Ect Mult Living   2 1 1  0 1 0 0 0 0 1     Review of Systems  Constitutional: Negative for fever and chills.  HENT: Positive for ear pain and sore throat.   Eyes: Negative for visual disturbance.  Respiratory: Positive for cough and wheezing. Negative for shortness of breath.   Cardiovascular: Negative for chest pain, palpitations and leg swelling.  Gastrointestinal: Negative for nausea, vomiting and abdominal pain.  Endocrine: Negative for polydipsia and polyuria.  Genitourinary: Negative for dysuria, urgency and frequency.  Musculoskeletal: Negative for arthralgias and myalgias.  Skin: Negative for rash.  Neurological: Negative for dizziness, weakness and light-headedness.    Allergies  Review of patient's allergies indicates no known allergies.  Home  Medications   Current Outpatient Rx  Name  Route  Sig  Dispense  Refill  . albuterol (PROVENTIL HFA;VENTOLIN HFA) 108 (90 BASE) MCG/ACT inhaler   Inhalation   Inhale 2 puffs into the lungs every 4 (four) hours as needed for wheezing or shortness of breath.   1 Inhaler   0   . chlorpheniramine-HYDROcodone (TUSSIONEX PENNKINETIC ER) 10-8 MG/5ML LQCR   Oral   Take 5 mLs by mouth every 12 (twelve) hours as needed.   115 mL   0   . Chlorpheniramine-PSE-Ibuprofen (ADVIL ALLERGY SINUS) 2-30-200 MG TABS      1-2 tabs PO Q4-6 hrs PRN   50 each   1   . diclofenac (CATAFLAM) 50 MG tablet   Oral   Take 1 tablet (50 mg total) by mouth 3 (three) times daily.   30 tablet   0   . methylPREDNISolone (MEDROL DOSEPAK) 4 MG tablet      follow package directions   21 tablet   0     Dispense as written.    LMP 11/26/2012  Breastfeeding? No Physical Exam  Nursing note and vitals reviewed. Constitutional: She is oriented to person, place, and time. Vital signs are normal. She appears well-developed and well-nourished. No distress.  HENT:  Head: Normocephalic and atraumatic.  Right Ear: Tympanic membrane, external ear and ear canal normal.  Left Ear: Tympanic membrane, external ear and ear canal normal.  Nose: Nose normal. Right sinus exhibits no maxillary sinus tenderness and no frontal sinus tenderness. Left sinus exhibits no maxillary sinus tenderness and no  frontal sinus tenderness.  Mouth/Throat: Uvula is midline, oropharynx is clear and moist and mucous membranes are normal.  Cardiovascular: Normal rate, regular rhythm and normal heart sounds.   Pulmonary/Chest: Effort normal and breath sounds normal. No respiratory distress.  Neurological: She is alert and oriented to person, place, and time. She has normal strength. Coordination normal.  Skin: Skin is warm and dry. No rash noted. She is not diaphoretic.  Psychiatric: She has a normal mood and affect. Judgment normal.    ED  Course  Procedures (including critical care time) Labs Review Labs Reviewed - No data to display Imaging Review No results found.    MDM   1. Acute nasopharyngitis (common cold)   2. URI (upper respiratory infection)    Normal physical exam. Treating symptomatically. Followup if not improving.  Meds ordered this encounter  Medications  . methylPREDNISolone (MEDROL DOSEPAK) 4 MG tablet    Sig: follow package directions    Dispense:  21 tablet    Refill:  0    Order Specific Question:  Supervising Provider    Answer:  Linna Hoff [phone]  . albuterol (PROVENTIL HFA;VENTOLIN HFA) 108 (90 BASE) MCG/ACT inhaler    Sig: Inhale 2 puffs into the lungs every 4 (four) hours as needed for wheezing or shortness of breath.    Dispense:  1 Inhaler    Refill:  0    Order Specific Question:  Supervising Provider    Answer:  Bradd Canary D [id number]  . Chlorpheniramine-PSE-Ibuprofen (ADVIL ALLERGY SINUS) 2-30-200 MG TABS    Sig: 1-2 tabs PO Q4-6 hrs PRN    Dispense:  50 each    Refill:  1    Order Specific Question:  Supervising Provider    Answer:  Bradd Canary D [[id number]]       Graylon Good, PA-C 12/16/12 [phone]

## 2012-12-18 LAB — CULTURE, GROUP A STREP

## 2012-12-19 NOTE — ED Notes (Signed)
Throat culture: Strep beta hemolytic not group A.  Message sent to Almedia Balls PA and Dr. Artis Flock. Vassie Moselle 12/19/2012

## 2012-12-20 ENCOUNTER — Telehealth (HOSPITAL_COMMUNITY): Payer: Self-pay | Admitting: *Deleted

## 2012-12-20 MED ORDER — AMOXICILLIN 500 MG PO CAPS: 500.0000 mg | ORAL_CAPSULE | Freq: Three times a day (TID) | ORAL | Status: DC

## 2012-12-20 NOTE — ED Notes (Signed)
Dr. Lorenz Coaster e-prescribed Amoxicillin to [facility] on [address] [facility]. I called pt. Pt. verified x 2 and given results.  Pt. Told she needs Amoxicillin for the strep throat and where to pick up her Rx. Pt. Instructed to take all of the medication and drink plenty of fluids. If anyone else she exposed gets the same symptoms, they should get checked for strep. Pt. voiced understanding. Alicia Perry 12/20/2012

## 2012-12-20 NOTE — ED Provider Notes (Signed)
Strep culture was back with beta-hemolytic, non-group A strep. Will treat with amoxicillin 500 mg 3 times a day for 10 days.  Reuben Likes, MD 12/20/12 2114

## 2013-11-27 ENCOUNTER — Encounter (HOSPITAL_COMMUNITY): Payer: Self-pay | Admitting: Emergency Medicine

## 2014-09-12 ENCOUNTER — Encounter (HOSPITAL_COMMUNITY): Payer: Self-pay | Admitting: Emergency Medicine

## 2014-09-12 ENCOUNTER — Emergency Department (INDEPENDENT_AMBULATORY_CARE_PROVIDER_SITE_OTHER)
Admission: EM | Admit: 2014-09-12 | Discharge: 2014-09-12 | Disposition: A | Discharge status: Home / Self Care | Payer: Self-pay | Source: Home / Self Care | Attending: Emergency Medicine | Admitting: Emergency Medicine

## 2014-09-12 DIAGNOSIS — M545 Low back pain, unspecified: Secondary | ICD-10-CM

## 2014-09-12 DIAGNOSIS — J40 Bronchitis, not specified as acute or chronic: Secondary | ICD-10-CM

## 2014-09-12 DIAGNOSIS — M546 Pain in thoracic spine: Secondary | ICD-10-CM

## 2014-09-12 MED ORDER — CYCLOBENZAPRINE HCL 10 MG PO TABS: 10.0000 mg | ORAL_TABLET | Freq: Three times a day (TID) | ORAL | Status: DC | PRN

## 2014-09-12 MED ORDER — BENZONATATE 100 MG PO CAPS: 100.0000 mg | ORAL_CAPSULE | Freq: Three times a day (TID) | ORAL | Status: DC | PRN

## 2014-09-12 MED ORDER — MELOXICAM 15 MG PO TABS: 15.0000 mg | ORAL_TABLET | Freq: Every day | ORAL | Status: DC

## 2014-09-12 MED ORDER — PREDNISONE 50 MG PO TABS: ORAL_TABLET | ORAL | Status: DC

## 2014-09-12 NOTE — ED Notes (Signed)
Pt has been suffering from back pain for about two weeks that she thought was a pulled muscle from recently starting to workout.  Since then, the pain has moved to her lower back and she has developed nasal congestion, and a cough over the last week, and the coughing is causing her chest to hurt when she coughs.  Pt denies any fever over the last week.

## 2014-09-12 NOTE — Discharge Instructions (Signed)
You have strained some muscles in your back. This is likely from when you started to work out. The cough has made it hard for it to heal. Take meloxicam daily for the next week, then as needed for pain. Take Flexeril up to 3 times a day as needed for muscle spasms. This medicine will make you drowsy.  The cough is from bronchitis. Please stop smoking. Take prednisone daily for 5 days. Use the Tessalon 3 times a day as needed for cough.  You should see improvement in the next week, but it may take several weeks to get back to 100%. Follow-up as needed.

## 2014-09-12 NOTE — ED Provider Notes (Signed)
CSN: [id number]     Arrival date & time 09/12/14  1309 History   First MD Initiated Contact with Patient 09/12/14 1352     Chief Complaint  Patient presents with  . Back Pain  . Chest Pain  . Cough  . Nasal Congestion   (Consider location/radiation/quality/duration/timing/severity/associated sxs/prior Treatment) HPI She is a 34 year old woman here for evaluation of cough and back pain. She states the back pain started about 2 weeks ago after she started working out. She states she started doing pushups and running stairs. She developed pain in her left upper back and right lower back. She states it is a constant ache. He gets worse with movement. She can feel it a little bit with deep breaths in the left upper back. She has taken aspirin and Advil with some mild improvement. She denies any radiating pain. No numbness, tingling, weakness in any of her extremities.  About one week ago, she developed a productive cough. She reports some shortness of breath with coughing spells. She is also heard some intermittent wheezing. No fevers, nasal congestion, rhinorrhea, sore throat, nausea, vomiting.  She reports some anterior chest pain with coughing only. She is a current smoker at half a pack per day.  Past Medical History  Diagnosis Date  . No pertinent past medical history    Past Surgical History  Procedure Laterality Date  . No past surgeries     Family History  Problem Relation Age of Onset  . Diabetes Father   . Hypertension Father   . Heart failure Father    Social History  Substance Use Topics  . Smoking status: Never Smoker   . Smokeless tobacco: None  . Alcohol Use: No   OB History    Gravida Para Term Preterm AB TAB SAB Ectopic Multiple Living   2 1 1  0 1 0 0 0 0 1     Review of Systems As in history of present illness Allergies  Review of patient's allergies indicates no known allergies.  Home Medications   Prior to Admission medications   Medication Sig Start  Date End Date Taking? Authorizing Provider  benzonatate (TESSALON) 100 MG capsule Take 1 capsule (100 mg total) by mouth 3 (three) times daily as needed for cough. 09/12/14   Charm Rings, MD  Chlorpheniramine-PSE-Ibuprofen (ADVIL ALLERGY SINUS) 2-30-200 MG TABS 1-2 tabs PO Q4-6 hrs PRN 12/16/12   Graylon Good, PA-C  cyclobenzaprine (FLEXERIL) 10 MG tablet Take 1 tablet (10 mg total) by mouth 3 (three) times daily as needed for muscle spasms. 09/12/14   Charm Rings, MD  diclofenac (CATAFLAM) 50 MG tablet Take 1 tablet (50 mg total) by mouth 3 (three) times daily. 07/13/12   Linna Hoff, MD  meloxicam (MOBIC) 15 MG tablet Take 1 tablet (15 mg total) by mouth daily. For 1 week, then as needed for pain 09/12/14   Charm Rings, MD  predniSONE (DELTASONE) 50 MG tablet Take 1 pill daily for 5 days. 09/12/14   Charm Rings, MD   BP 137/90 mmHg  Pulse 79  Temp(Src) 98.4 F (36.9 C) (Oral)  Resp 16  SpO2 100%  LMP 08/22/2014 (Exact Date) Physical Exam  Constitutional: She is oriented to person, place, and time. She appears well-developed and well-nourished. No distress.  HENT:  Nose: Nose normal.  Mouth/Throat: Oropharynx is clear and moist. No oropharyngeal exudate.  Eyes: Conjunctivae are normal.  Neck: Neck supple.  Cardiovascular: Normal rate, regular rhythm and normal  heart sounds.   No murmur heard. Pulmonary/Chest: Effort normal and breath sounds normal. No respiratory distress. She has no wheezes. She exhibits tenderness.  Musculoskeletal:  Back: No erythema or edema. No vertebral tenderness or step-offs. She has some tenderness over the left thoracic area and right lower back. There is mild spasm in the right lower back. 5 out of 5 strength in all extremities.  Lymphadenopathy:    She has no cervical adenopathy.  Neurological: She is alert and oriented to person, place, and time.    ED Course  Procedures (including critical care time) Labs Review Labs Reviewed - No data to  display  Imaging Review No results found.   MDM   1. Bronchitis   2. Left-sided thoracic back pain   3. Right-sided low back pain without sciatica    Treatment bronchitis with prednisone and Tessalon. Treat back pain with Flexeril and meloxicam. Follow-up if no improvement in 1-2 weeks.    Charm Rings, MD 09/12/14 [phone]

## 2014-10-23 ENCOUNTER — Emergency Department (INDEPENDENT_AMBULATORY_CARE_PROVIDER_SITE_OTHER)
Admission: EM | Admit: 2014-10-23 | Discharge: 2014-10-23 | Disposition: A | Discharge status: Home / Self Care | Payer: Self-pay | Source: Home / Self Care | Attending: Emergency Medicine | Admitting: Emergency Medicine

## 2014-10-23 ENCOUNTER — Encounter (HOSPITAL_COMMUNITY): Payer: Self-pay | Admitting: Emergency Medicine

## 2014-10-23 DIAGNOSIS — R599 Enlarged lymph nodes, unspecified: Secondary | ICD-10-CM

## 2014-10-23 DIAGNOSIS — R590 Localized enlarged lymph nodes: Secondary | ICD-10-CM

## 2014-10-23 DIAGNOSIS — H1033 Unspecified acute conjunctivitis, bilateral: Secondary | ICD-10-CM

## 2014-10-23 DIAGNOSIS — J3089 Other allergic rhinitis: Secondary | ICD-10-CM

## 2014-10-23 MED ORDER — IPRATROPIUM BROMIDE 0.06 % NA SOLN: 2.0000 | Freq: Four times a day (QID) | NASAL | Status: DC

## 2014-10-23 MED ORDER — POLYMYXIN B-TRIMETHOPRIM 10000-0.1 UNIT/ML-% OP SOLN: 1.0000 [drp] | OPHTHALMIC | Status: DC

## 2014-10-23 MED ORDER — AMOXICILLIN 500 MG PO CAPS: 1000.0000 mg | ORAL_CAPSULE | Freq: Two times a day (BID) | ORAL | Status: DC

## 2014-10-23 NOTE — ED Provider Notes (Signed)
CSN: [id number]     Arrival date & time 10/23/14  1309 History   First MD Initiated Contact with Patient 10/23/14 1350     Chief Complaint  Patient presents with  . Conjunctivitis   (Consider location/radiation/quality/duration/timing/severity/associated sxs/prior Treatment) HPI Comments: 34 year old female states she is here for pinkeye and preauricular soreness and pain along with minor right facial swelling. Denies sore throat, home fevers, cough or congestion. She is having sniffles and some PND. The redness and swelling to the right eye was noticed 3 days ago and has progressively been getting worse. She has tried no treatments.   Past Medical History  Diagnosis Date  . No pertinent past medical history    Past Surgical History  Procedure Laterality Date  . No past surgeries     Family History  Problem Relation Age of Onset  . Diabetes Father   . Hypertension Father   . Heart failure Father    Social History  Substance Use Topics  . Smoking status: Never Smoker   . Smokeless tobacco: None  . Alcohol Use: No   OB History    Gravida Para Term Preterm AB TAB SAB Ectopic Multiple Living   0 1 0 0 0 0 1     Review of Systems  Constitutional: Negative for fever, activity change and fatigue.  HENT: Positive for postnasal drip and rhinorrhea. Negative for sore throat.   Eyes: Positive for discharge, redness and itching. Negative for visual disturbance.  Respiratory: Negative.   Cardiovascular: Negative.   Gastrointestinal: Negative.   Neurological: Negative.   All other systems reviewed and are negative.   Allergies  Review of patient's allergies indicates no known allergies.  Home Medications   Prior to Admission medications   Medication Sig Start Date End Date Taking? Authorizing Marvelene Stoneberg  amoxicillin (AMOXIL) 500 MG capsule Take 2 capsules (1,000 mg total) by mouth 2 (two) times daily. 10/23/14   Hayden Rasmussen, NP  benzonatate (TESSALON) 100 MG capsule Take 1  capsule (100 mg total) by mouth 3 (three) times daily as needed for cough. 09/12/14   Charm Rings, MD  Chlorpheniramine-PSE-Ibuprofen (ADVIL ALLERGY SINUS) 2-30-200 MG TABS 1-2 tabs PO Q4-6 hrs PRN 12/16/12   Graylon Good, PA-C  cyclobenzaprine (FLEXERIL) 10 MG tablet Take 1 tablet (10 mg total) by mouth 3 (three) times daily as needed for muscle spasms. 09/12/14   Charm Rings, MD  diclofenac (CATAFLAM) 50 MG tablet Take 1 tablet (50 mg total) by mouth 3 (three) times daily. 07/13/12   Linna Hoff, MD  ipratropium (ATROVENT) 0.06 % nasal spray Place 2 sprays into both nostrils 4 (four) times daily. 10/23/14   Hayden Rasmussen, NP  meloxicam (MOBIC) 15 MG tablet Take 1 tablet (15 mg total) by mouth daily. For 1 week, then as needed for pain 09/12/14   Charm Rings, MD  predniSONE (DELTASONE) 50 MG tablet Take 1 pill daily for 5 days. 09/12/14   Charm Rings, MD  trimethoprim-polymyxin b (POLYTRIM) ophthalmic solution Place 1 drop into both eyes every 4 (four) hours. 10/23/14   Hayden Rasmussen, NP   Meds Ordered and Administered this Visit  Medications - No data to display  BP 131/91 mmHg  Pulse 80  Temp(Src) 100 F (37.8 C) (Oral)  Resp 16  SpO2 98% No data found.   Physical Exam  Constitutional: She is oriented to person, place, and time. She appears well-developed and well-nourished. No distress.  HENT:  Head:  Normocephalic and atraumatic.  Mouth/Throat: Oropharynx is clear and moist. No oropharyngeal exudate.  Retracted but no erythema. No effusion or fluid seen behind the ear. There is tenderness with a very small nodule in the preauricular area. This is associated with minor right facial swelling just anterior to this area. Left TM is normal.   Eyes: EOM are normal.  Right TM with upper and lower eyelid swelling without erythema. There is an lower conjunctival erythema and swelling. The sclera is injected. Anterior chamber is clear. There is a clear mucoid/watery drainage from the right  eye. Left eye with mild lower lid conjunctival erythema. No drainage.  Neck: Normal range of motion. Neck supple.  Cardiovascular: Normal rate and normal heart sounds.   Pulmonary/Chest: Effort normal. No respiratory distress. She has no wheezes. She has no rales.  Musculoskeletal: She exhibits no edema.  Lymphadenopathy:    She has no cervical adenopathy.  Neurological: She is alert and oriented to person, place, and time. She exhibits normal muscle tone.  Skin: Skin is warm and dry.  Psychiatric: She has a normal mood and affect.  Nursing note and vitals reviewed.   ED Course  Procedures (including critical care time)  Labs Review Labs Reviewed - No data to display  Imaging Review No results found.   Visual Acuity Review  Right Eye Distance:   Left Eye Distance:   Bilateral Distance:    Right Eye Near:   Left Eye Near:    Bilateral Near:         MDM   1. Conjunctivitis, acute, bilateral   2. Preauricular adenopathy   3. Other allergic rhinitis    polytrim eye gtts Zaditor  Amoxicillin atrovent nasal spray Warm compresses     Hayden Rasmussen, NP 10/23/14 1422

## 2014-10-23 NOTE — ED Notes (Signed)
Here for bilateral pink eye and swelling on right side of face onset Saturday Sx include pain  Denies fevers, chills Alert and oriented x4... No acute distress.

## 2014-10-23 NOTE — Discharge Instructions (Signed)
Allergic Rhinitis Allergic rhinitis is when the mucous membranes in the nose respond to allergens. Allergens are particles in the air that cause your body to have an allergic reaction. This causes you to release allergic antibodies. Through a chain of events, these eventually cause you to release histamine into the blood stream. Although meant to protect the body, it is this release of histamine that causes your discomfort, such as frequent sneezing, congestion, and an itchy, runny nose.  CAUSES  Seasonal allergic rhinitis (hay fever) is caused by pollen allergens that may come from grasses, trees, and weeds. Year-round allergic rhinitis (perennial allergic rhinitis) is caused by allergens such as house dust mites, pet dander, and mold spores.  SYMPTOMS   Nasal stuffiness (congestion).  Itchy, runny nose with sneezing and tearing of the eyes. DIAGNOSIS  Your health care provider can help you determine the allergen or allergens that trigger your symptoms. If you and your health care provider are unable to determine the allergen, skin or blood testing may be used. TREATMENT  Allergic rhinitis does not have a cure, but it can be controlled by:  Medicines and allergy shots (immunotherapy).  Avoiding the allergen. Hay fever may often be treated with antihistamines in pill or nasal spray forms. Antihistamines block the effects of histamine. There are over-the-counter medicines that may help with nasal congestion and swelling around the eyes. Check with your health care provider before taking or giving this medicine.  If avoiding the allergen or the medicine prescribed do not work, there are many new medicines your health care provider can prescribe. Stronger medicine may be used if initial measures are ineffective. Desensitizing injections can be used if medicine and avoidance does not work. Desensitization is when a patient is given ongoing shots until the body becomes less sensitive to the allergen.  Make sure you follow up with your health care provider if problems continue. HOME CARE INSTRUCTIONS It is not possible to completely avoid allergens, but you can reduce your symptoms by taking steps to limit your exposure to them. It helps to know exactly what you are allergic to so that you can avoid your specific triggers. SEEK MEDICAL CARE IF:   You have a fever.  You develop a cough that does not stop easily (persistent).  You have shortness of breath.  You start wheezing.  Symptoms interfere with normal daily activities. Document Released: 10/07/2000 Document Revised: 01/17/2013 Document Reviewed: 09/19/2012 [facility] Patient Information 2015 [place], [place]. This information is not intended to replace advice given to you by your health care provider. Make sure you discuss any questions you have with your health care provider.  Conjunctivitis Obtain Zaditor eye drops over the counter and use as dir Conjunctivitis is commonly called "pink eye." Conjunctivitis can be caused by bacterial or viral infection, allergies, or injuries. There is usually redness of the lining of the eye, itching, discomfort, and sometimes discharge. There may be deposits of matter along the eyelids. A viral infection usually causes a watery discharge, while a bacterial infection causes a yellowish, thick discharge. Pink eye is very contagious and spreads by direct contact. You may be given antibiotic eyedrops as part of your treatment. Before using your eye medicine, remove all drainage from the eye by washing gently with warm water and cotton balls. Continue to use the medication until you have awakened 2 mornings in a row without discharge from the eye. Do not rub your eye. This increases the irritation and helps spread infection. Use separate towels from  other household members. Wash your hands with soap and water before and after touching your eyes. Use cold compresses to reduce pain and sunglasses to relieve  irritation from light. Do not wear contact lenses or wear eye makeup until the infection is gone. SEEK MEDICAL CARE IF:   Your symptoms are not better after 3 days of treatment.  You have increased pain or trouble seeing.  The outer eyelids become very red or swollen. Document Released: 02/20/2004 Document Revised: 04/06/2011 Document Reviewed: 01/12/2005 [facility] Patient Information 2015 [place], [place]. This information is not intended to replace advice given to you by your health care provider. Make sure you discuss any questions you have with your health care provider.  Eye Drops Use eye drops as directed. It may be easier to have someone help you put the drops in your eye. If you are alone, use the following instructions to help you.  Wash your hands before putting drops in your eyes.  Read the label and look at your medication. Check for any expiration date that may appear on the bottle or tube. Changes of color may be a warning that the medication is old or ineffective. This is especially true if the medication has become brown in color. If you have questions or concerns, call your caregiver. DROPS  Tilt your head back with the affected eye uppermost. Gently pull down on your lower lid. Do not pull up on the upper lid.  Look up. Place the dropper or bottle just over the edge of the lower lid near the white portion at the bottom of the eye. The goal is to have the drop go into the little sac formed by the lower lid and the bottom of the eye itself. Do not release the drop from a height of several inches over the eye. That will only serve to startle the person receiving the medicine when it lands and forces a blink.  Steady your hand in a comfortable manner. An example would be to hold the dropper or bottle between your thumb and index (pointing) finger. Lean your index finger against the brow.  Then, slowly and gently squeeze one drop of medication into your eye.  Once the medication  has been applied, place your finger between the lower eyelid and the nose, pressing firmly against the nose for 5-10 seconds. This will slow the process of the eye drop entering the small canal that normally drains tears into the nose, and therefore increases the exposure of the medicine to the eye for a few extra seconds. OINTMENTS  Look up. Place the tip of the tube just over the edge of the lower lid near the white portion at the bottom of the eye. The goal is to create a line of ointment along the inner surface of the eyelid in the little sac formed by the lower lid and the bottom of the eye itself.  Avoid touching the tube tip to your eyeball or eyelid. This avoids contamination of the tube or the medicine in the tube.  Once a line of medicine has been created, hold the upper lid up and look down before releasing the upper lid. This will force the ointment to spread over the surface of the eye.  Your vision will be very blurry for a few minutes after applying an ointment properly. This is normal and will clear as you continue to blink. For this reason, it is best to apply ointments just before going to sleep, or at a time  when you can rest your eyes for 5-10 minutes after applying the medication. GENERAL  Store your medicine in a cool, dry place after each use.  If you need a second medication, wait at least two minutes. This helps the first medication to be taken up (absorbed) by the eye.  If you have been instructed to use both an eye drop and an eye ointment, always apply the drop first and then the ointment 3-4 minutes afterward. Never put medications into the eye unless the label reads, "For Ophthalmic Use," "For Use In Eyes" or "Eye Drops." If you have questions, call your caregiver. Document Released: 04/20/2000 Document Revised: 05/29/2013 Document Reviewed: 06/26/2008 [facility] Patient Information 2015 [place], [place]. This information is not intended to replace advice given to you by  your health care provider. Make sure you discuss any questions you have with your health care provider.

## 2015-08-29 ENCOUNTER — Ambulatory Visit (HOSPITAL_COMMUNITY)
Admission: EM | Admit: 2015-08-29 | Discharge: 2015-08-29 | Disposition: A | Discharge status: Home / Self Care | Payer: PRIVATE HEALTH INSURANCE | Attending: Emergency Medicine | Admitting: Emergency Medicine

## 2015-08-29 ENCOUNTER — Encounter (HOSPITAL_COMMUNITY): Payer: Self-pay | Admitting: Emergency Medicine

## 2015-08-29 DIAGNOSIS — J0100 Acute maxillary sinusitis, unspecified: Secondary | ICD-10-CM

## 2015-08-29 MED ORDER — AMOXICILLIN 500 MG PO CAPS: 500.0000 mg | ORAL_CAPSULE | Freq: Three times a day (TID) | ORAL | 0 refills | Status: DC

## 2015-08-29 MED ORDER — FLUTICASONE PROPIONATE 50 MCG/ACT NA SUSP: 2.0000 | Freq: Every day | NASAL | 0 refills | Status: DC

## 2015-08-29 NOTE — ED Provider Notes (Signed)
[facility]    CSN: [id number] Arrival date & time: 08/29/15  1509  First Provider Contact:  First MD Initiated Contact with Patient 08/29/15 1615        History   Chief Complaint Chief Complaint  Patient presents with  . Facial Pain    HPI Alicia Perry is a 35 y.o. female.   She is a 35 year old woman here for evaluation of facial pain. She states it started on Monday with pressure in her sinuses and ears. This is associated with a cough. She denies any significant nasal congestion or rhinorrhea. No sore throat. No fevers or chills. She tried taking ibuprofen and Mucinex on Monday with minimal improvement. No nausea or vomiting. No sensitivity to light.    Past Medical History:  Diagnosis Date  . No pertinent past medical history     There are no active problems to display for this patient.   Past Surgical History:  Procedure Laterality Date  . NO PAST SURGERIES      OB History    Gravida Para Term Preterm AB Living   0 1 1   SAB TAB Ectopic Multiple Live Births   0 0 0 0 1       Home Medications    Prior to Admission medications   Medication Sig Start Date End Date Taking? Authorizing Provider  amoxicillin (AMOXIL) 500 MG capsule Take 1 capsule (500 mg total) by mouth 3 (three) times daily. 08/29/15   Charm Rings, MD  cyclobenzaprine (FLEXERIL) 10 MG tablet Take 1 tablet (10 mg total) by mouth 3 (three) times daily as needed for muscle spasms. 09/12/14   Charm Rings, MD  diclofenac (CATAFLAM) 50 MG tablet Take 1 tablet (50 mg total) by mouth 3 (three) times daily. 07/13/12   Linna Hoff, MD  fluticasone (FLONASE) 50 MCG/ACT nasal spray Place 2 sprays into both nostrils daily. 08/29/15   Charm Rings, MD  meloxicam (MOBIC) 15 MG tablet Take 1 tablet (15 mg total) by mouth daily. For 1 week, then as needed for pain 09/12/14   Charm Rings, MD    Family History Family History  Problem Relation Age of Onset  . Diabetes Father   .  Hypertension Father   . Heart failure Father     Social History Social History  Substance Use Topics  . Smoking status: Current Every Day Smoker    Packs/day: 0.50  . Smokeless tobacco: Never Used  . Alcohol use Yes     Allergies   Review of patient's allergies indicates no known allergies.   Review of Systems Review of Systems  Constitutional: Negative for fever.  HENT: Positive for ear pain (pressure) and sinus pressure. Negative for congestion, postnasal drip, rhinorrhea, sore throat and trouble swallowing.   Eyes: Negative for photophobia.  Respiratory: Positive for cough. Negative for shortness of breath.   Gastrointestinal: Negative for nausea and vomiting.  Neurological: Positive for headaches.     Physical Exam Triage Vital Signs ED Triage Vitals  Enc Vitals Group     BP 08/29/15 1555 (!) 154/109     Pulse Rate 08/29/15 1555 62     Resp 08/29/15 1555 16     Temp 08/29/15 1555 98.7 F (37.1 C)     Temp Source 08/29/15 1555 Oral     SpO2 08/29/15 1555 100 %     Weight --      Height --  Head Circumference --      Peak Flow --      Pain Score 08/29/15 1551 6     Pain Loc --      Pain Edu? --      Excl. in GC? --    No data found.   Updated Vital Signs BP 140/86 (BP Location: Right Arm)   Pulse 62   Temp 98.7 F (37.1 C) (Oral)   Resp 16   LMP 08/01/2015   SpO2 100%   Visual Acuity Right Eye Distance:   Left Eye Distance:   Bilateral Distance:    Right Eye Near:   Left Eye Near:    Bilateral Near:     Physical Exam  Constitutional: She appears well-developed and well-nourished. No distress.  HENT:  Mouth/Throat: Oropharynx is clear and moist. No oropharyngeal exudate.  Nasal mucosa is erythematous and slightly swollen. TMs normal bilaterally. Sinus tenderness worse over the maxillary sinuses.  Cardiovascular: Normal rate, regular rhythm and normal heart sounds.   No murmur heard. Pulmonary/Chest: Effort normal and breath sounds  normal. She has no wheezes. She has no rales.  Lymphadenopathy:    She has no cervical adenopathy.     UC Treatments / Results  Labs (all labs ordered are listed, but only abnormal results are displayed) Labs Reviewed - No data to display  EKG  EKG Interpretation None       Radiology No results found.  Procedures Procedures (including critical care time)  Medications Ordered in UC Medications - No data to display   Initial Impression / Assessment and Plan / UC Course  I have reviewed the triage vital signs and the nursing notes.  Pertinent labs & imaging results that were available during my care of the patient were reviewed by me and considered in my medical decision making (see chart for details).  Clinical Course    Sinusitis, likely viral in etiology. Symptomatic treatment with OTC allergy medication and Flonase. Also recommended frequent saline spray or 90 use. If she develops fevers or symptoms do not improve by Monday, she will fill the prescription for amoxicillin.  Final Clinical Impressions(s) / UC Diagnoses   Final diagnoses:  Acute maxillary sinusitis, recurrence not specified    New Prescriptions New Prescriptions   AMOXICILLIN (AMOXIL) 500 MG CAPSULE    Take 1 capsule (500 mg total) by mouth 3 (three) times daily.   FLUTICASONE (FLONASE) 50 MCG/ACT NASAL SPRAY    Place 2 sprays into both nostrils daily.     Charm Rings, MD 08/29/15 [phone]

## 2015-08-29 NOTE — ED Triage Notes (Signed)
PT reports facial pain, pressure, productive cough, and headache since Monday.

## 2015-08-29 NOTE — Discharge Instructions (Signed)
You have inflammation of your sinuses. This is likely caused by a virus. Please get an over-the-counter allergy medicine such as Claritin or Zyrtec and take this once a day. Use Flonase once a day for the next week. Please use a Nettie pot or nasal saline spray as often as you can to help wash out the sinuses. Alternate Tylenol and ibuprofen to help with the headache. This should improve over the next 3-4 days. If your symptoms are not any better on Monday or you develop fevers, please fill the prescription for amoxicillin. Follow-up as needed.

## 2018-04-21 ENCOUNTER — Ambulatory Visit: Payer: BLUE CROSS/BLUE SHIELD | Admitting: Internal Medicine

## 2018-04-21 ENCOUNTER — Encounter: Payer: Self-pay | Admitting: Internal Medicine

## 2018-04-21 ENCOUNTER — Ambulatory Visit: Payer: Self-pay | Admitting: Nurse Practitioner

## 2018-04-21 ENCOUNTER — Other Ambulatory Visit: Payer: Self-pay

## 2018-04-21 VITALS — BP 140/85 | HR 73 | Temp 98.2°F | Ht 63.6 in | Wt 196.2 lb

## 2018-04-21 DIAGNOSIS — H6500 Acute serous otitis media, unspecified ear: Secondary | ICD-10-CM

## 2018-04-21 DIAGNOSIS — R7309 Other abnormal glucose: Secondary | ICD-10-CM | POA: Diagnosis not present

## 2018-04-21 DIAGNOSIS — J301 Allergic rhinitis due to pollen: Secondary | ICD-10-CM

## 2018-04-21 DIAGNOSIS — A630 Anogenital (venereal) warts: Secondary | ICD-10-CM | POA: Insufficient documentation

## 2018-04-21 DIAGNOSIS — Z1212 Encounter for screening for malignant neoplasm of rectum: Secondary | ICD-10-CM | POA: Diagnosis not present

## 2018-04-21 DIAGNOSIS — E669 Obesity, unspecified: Secondary | ICD-10-CM | POA: Diagnosis not present

## 2018-04-21 DIAGNOSIS — R030 Elevated blood-pressure reading, without diagnosis of hypertension: Secondary | ICD-10-CM | POA: Diagnosis not present

## 2018-04-21 DIAGNOSIS — Z0001 Encounter for general adult medical examination with abnormal findings: Secondary | ICD-10-CM | POA: Diagnosis not present

## 2018-04-21 LAB — POCT URINALYSIS DIPSTICK
Bilirubin, UA: NEGATIVE
GLUCOSE UA: NEGATIVE
Ketones, UA: NEGATIVE
LEUKOCYTES UA: NEGATIVE
NITRITE UA: NEGATIVE
PH UA: 6 (ref 5.0–8.0)
PROTEIN UA: NEGATIVE
RBC UA: NEGATIVE
Spec Grav, UA: <=1.005 — AB (ref 1.010–1.025)
UROBILINOGEN UA: 0.2 U/dL

## 2018-04-21 NOTE — Patient Instructions (Addendum)
Use flonase 2 sprays each nostril once a day for L ear pressure, fluid and sinus inflammation causing pain.  Do blood pressure diaries and your blood pressure needs to be less than 140/90, if there are more 3 or more times, send me a mychart message.    Eustachian Tube Dysfunction  Eustachian tube dysfunction refers to a condition in which a blockage develops in the narrow passage that connects the middle ear to the back of the nose (eustachian tube). The eustachian tube regulates air pressure in the middle ear by letting air move between the ear and nose. It also helps to drain fluid from the middle ear space. Eustachian tube dysfunction can affect one or both ears. When the eustachian tube does not function properly, air pressure, fluid, or both can build up in the middle ear. What are the causes? This condition occurs when the eustachian tube becomes blocked or cannot open normally. Common causes of this condition include:  Ear infections.  Colds and other infections that affect the nose, mouth, and throat (upper respiratory tract).  Allergies.  Irritation from cigarette smoke.  Irritation from stomach acid coming up into the esophagus (gastroesophageal reflux). The esophagus is the tube that carries food from the mouth to the stomach.  Sudden changes in air pressure, such as from descending in an airplane or scuba diving.  Abnormal growths in the nose or throat, such as: ? Growths that line the nose (nasal polyps). ? Abnormal growth of cells (tumors). ? Enlarged tissue at the back of the throat (adenoids). What increases the risk? You are more likely to develop this condition if:  You smoke.  You are overweight.  You are a child who has: ? Certain birth defects of the mouth, such as cleft palate. ? Large tonsils or adenoids. What are the signs or symptoms? Common symptoms of this condition include:  A feeling of fullness in the ear.  Ear pain.  Clicking or popping  noises in the ear.  Ringing in the ear.  Hearing loss.  Loss of balance.  Dizziness. Symptoms may get worse when the air pressure around you changes, such as when you travel to an area of high elevation, fly on an airplane, or go scuba diving. How is this diagnosed? This condition may be diagnosed based on:  Your symptoms.  A physical exam of your ears, nose, and throat.  Tests, such as those that measure: ? The movement of your eardrum (tympanogram). ? Your hearing (audiometry). How is this treated? Treatment depends on the cause and severity of your condition.  In mild cases, you may relieve your symptoms by moving air into your ears. This is called "popping the ears."  In more severe cases, or if you have symptoms of fluid in your ears, treatment may include: ? Medicines to relieve congestion (decongestants). ? Medicines that treat allergies (antihistamines). ? Nasal sprays or ear drops that contain medicines that reduce swelling (steroids). ? A procedure to drain the fluid in your eardrum (myringotomy). In this procedure, a small tube is placed in the eardrum to:  Drain the fluid.  Restore the air in the middle ear space. ? A procedure to insert a balloon device through the nose to inflate the opening of the eustachian tube (balloon dilation). Follow these instructions at home: Lifestyle  Do not do any of the following until your health care provider approves: ? Travel to high altitudes. ? Fly in airplanes. ? Work in a Estate agent or room. ?  Scuba dive.  Do not use any products that contain nicotine or tobacco, such as cigarettes and e-cigarettes. If you need help quitting, ask your health care provider.  Keep your ears dry. Wear fitted earplugs during showering and bathing. Dry your ears completely after. General instructions  Take over-the-counter and prescription medicines only as told by your health care provider.  Use techniques to help pop your ears  as recommended by your health care provider. These may include: ? Chewing gum. ? Yawning. ? Frequent, forceful swallowing. ? Closing your mouth, holding your nose closed, and gently blowing as if you are trying to blow air out of your nose.  Keep all follow-up visits as told by your health care provider. This is important. Contact a health care provider if:  Your symptoms do not go away after treatment.  Your symptoms come back after treatment.  You are unable to pop your ears.  You have: ? A fever. ? Pain in your ear. ? Pain in your head or neck. ? Fluid draining from your ear.  Your hearing suddenly changes.  You become very dizzy.  You lose your balance. Summary  Eustachian tube dysfunction refers to a condition in which a blockage develops in the eustachian tube.  It can be caused by ear infections, allergies, inhaled irritants, or abnormal growths in the nose or throat.  Symptoms include ear pain, hearing loss, or ringing in the ears.  Mild cases are treated with maneuvers to unblock the ears, such as yawning or ear popping.  Severe cases are treated with medicines. Surgery may also be done (rare). This information is not intended to replace advice given to you by your health care provider. Make sure you discuss any questions you have with your health care provider. Document Released: 02/08/2015 Document Revised: 05/04/2017 Document Reviewed: 05/04/2017 Elsevier Interactive Patient Education  2019 [employer]. Calorie Counting for [employer] Calories are units of energy. Your body needs a certain amount of calories from food to keep you going throughout the day. When you eat more calories than your body needs, your body stores the extra calories as fat. When you eat fewer calories than your body needs, your body burns fat to get the energy it needs. Calorie counting means keeping track of how many calories you eat and drink each day. Calorie counting can be helpful  if you need to lose weight. If you make sure to eat fewer calories than your body needs, you should lose weight. Ask your health care provider what a healthy weight is for you. For calorie counting to work, you will need to eat the right number of calories in a day in order to lose a healthy amount of weight per week. A dietitian can help you determine how many calories you need in a day and will give you suggestions on how to reach your calorie goal.  A healthy amount of weight to lose per week is usually 1-2 lb (0.5-0.9 kg). This usually means that your daily calorie intake should be reduced by 500-750 calories.  Eating 1,200 - 1,500 calories per day can help most women lose weight.  Eating 1,500 - 1,800 calories per day can help most men lose weight. What is my plan? My goal is to have __________ calories per day. If I have this many calories per day, I should lose around ____1-2_pounds per week. What do I need to know about calorie counting? In order to meet your daily calorie goal, you will need  to:  Find out how many calories are in each food you would like to eat. Try to do this before you eat.  Decide how much of the food you plan to eat.  Write down what you ate and how many calories it had. Doing this is called keeping a food log. To successfully lose weight, it is important to balance calorie counting with a healthy lifestyle that includes regular activity. Aim for 150 minutes of moderate exercise (such as walking) or 75 minutes of vigorous exercise (such as running) each week. Where do I find calorie information?  The number of calories in a food can be found on a Nutrition Facts label. If a food does not have a Nutrition Facts label, try to look up the calories online or ask your dietitian for help. Remember that calories are listed per serving. If you choose to have more than one serving of a food, you will have to multiply the calories per serving by the amount of servings you  plan to eat. For example, the label on a package of bread might say that a serving size is 1 slice and that there are 90 calories in a serving. If you eat 1 slice, you will have eaten 90 calories. If you eat 2 slices, you will have eaten 180 calories. How do I keep a food log? Immediately after each meal, record the following information in your food log:  What you ate. Don't forget to include toppings, sauces, and other extras on the food.  How much you ate. This can be measured in cups, ounces, or number of items.  How many calories each food and drink had.  The total number of calories in the meal. Keep your food log near you, such as in a small notebook in your pocket, or use a mobile app or website. Some programs will calculate calories for you and show you how many calories you have left for the day to meet your goal. What are some calorie counting tips?   Use your calories on foods and drinks that will fill you up and not leave you hungry: ? Some examples of foods that fill you up are nuts and nut butters, vegetables, lean proteins, and high-fiber foods like whole grains. High-fiber foods are foods with more than 5 g fiber per serving. ? Drinks such as sodas, specialty coffee drinks, alcohol, and juices have a lot of calories, yet do not fill you up.  Eat nutritious foods and avoid empty calories. Empty calories are calories you get from foods or beverages that do not have many vitamins or protein, such as candy, sweets, and soda. It is better to have a nutritious high-calorie food (such as an avocado) than a food with few nutrients (such as a bag of chips).  Know how many calories are in the foods you eat most often. This will help you calculate calorie counts faster.  Pay attention to calories in drinks. Low-calorie drinks include water and unsweetened drinks.  Pay attention to nutrition labels for "low fat" or "fat free" foods. These foods sometimes have the same amount of calories  or more calories than the full fat versions. They also often have added sugar, starch, or salt, to make up for flavor that was removed with the fat.  Find a way of tracking calories that works for you. Get creative. Try different apps or programs if writing down calories does not work for you. What are some portion control tips?  Know  how many calories are in a serving. This will help you know how many servings of a certain food you can have.  Use a measuring cup to measure serving sizes. You could also try weighing out portions on a kitchen scale. With time, you will be able to estimate serving sizes for some foods.  Take some time to put servings of different foods on your favorite plates, bowls, and cups so you know what a serving looks like.  Try not to eat straight from a bag or box. Doing this can lead to overeating. Put the amount you would like to eat in a cup or on a plate to make sure you are eating the right portion.  Use smaller plates, glasses, and bowls to prevent overeating.  Try not to multitask (for example, watch TV or use your computer) while eating. If it is time to eat, sit down at a table and enjoy your food. This will help you to know when you are full. It will also help you to be aware of what you are eating and how much you are eating. What are tips for following this plan? Reading food labels  Check the calorie count compared to the serving size. The serving size may be smaller than what you are used to eating.  Check the source of the calories. Make sure the food you are eating is high in vitamins and protein and low in saturated and trans fats. Shopping  Read nutrition labels while you shop. This will help you make healthy decisions before you decide to purchase your food.  Make a grocery list and stick to it. Cooking  Try to cook your favorite foods in a healthier way. For example, try baking instead of frying.  Use low-fat dairy products. Meal planning   Use more fruits and vegetables. Half of your plate should be fruits and vegetables.  Include lean proteins like poultry and fish. How do I count calories when eating out?  Ask for smaller portion sizes.  Consider sharing an entree and sides instead of getting your own entree.  If you get your own entree, eat only half. Ask for a box at the beginning of your meal and put the rest of your entree in it so you are not tempted to eat it.  If calories are listed on the menu, choose the lower calorie options.  Choose dishes that include vegetables, fruits, whole grains, low-fat dairy products, and lean protein.  Choose items that are boiled, broiled, grilled, or steamed. Stay away from items that are buttered, battered, fried, or served with cream sauce. Items labeled "crispy" are usually fried, unless stated otherwise.  Choose water, low-fat milk, unsweetened iced tea, or other drinks without added sugar. If you want an alcoholic beverage, choose a lower calorie option such as a glass of wine or light beer.  Ask for dressings, sauces, and syrups on the side. These are usually high in calories, so you should limit the amount you eat.  If you want a salad, choose a garden salad and ask for grilled meats. Avoid extra toppings like bacon, cheese, or fried items. Ask for the dressing on the side, or ask for olive oil and vinegar or lemon to use as dressing.  Estimate how many servings of a food you are given. For example, a serving of cooked rice is  cup or about the size of half a baseball. Knowing serving sizes will help you be aware of how much food you  are eating at restaurants. The list below tells you how big or small some common portion sizes are based on everyday objects: ? 1 oz-4 stacked dice. ? 3 oz-1 deck of cards. ? 1 tsp-1 die. ? 1 Tbsp- a ping-pong ball. ? 2 Tbsp-1 ping-pong ball. ?  cup- baseball. ? 1 cup-1 baseball. Summary  Calorie counting means keeping track of how many  calories you eat and drink each day. If you eat fewer calories than your body needs, you should lose weight.  A healthy amount of weight to lose per week is usually 1-2 lb (0.5-0.9 kg). This usually means reducing your daily calorie intake by 500-750 calories.  The number of calories in a food can be found on a Nutrition Facts label. If a food does not have a Nutrition Facts label, try to look up the calories online or ask your dietitian for help.  Use your calories on foods and drinks that will fill you up, and not on foods and drinks that will leave you hungry.  Use smaller plates, glasses, and bowls to prevent overeating. This information is not intended to replace advice given to you by your health care provider. Make sure you discuss any questions you have with your health care provider. Document Released: 01/12/2005 Document Revised: 10/01/2017 Document Reviewed: 12/13/2015 Elsevier Interactive Patient Education  2019 [employer].

## 2018-04-21 NOTE — Progress Notes (Addendum)
Subjective:     Patient ID: Alicia Perry , female    DOB: 1980/12/29 , 38 y.o.   MRN: [id number]   Chief Complaint  Patient presents with  . New Patient (Initial Visit)  . Annual Exam    HPI Pt is here to establish care and would like to have a physical.  She has not a physical in 6 years. She is up to date on her pap and mammogram. Sees GYN.  Has L forehead and L ear pain on L ear, and has + allergies all year.   Past Medical History:  Diagnosis Date  . No pertinent past medical history      Family History  Problem Relation Age of Onset  . Diabetes Father   . Hypertension Father   . Heart failure Father     No current outpatient medications on file.   No Known Allergies   Review of Systems  HENT: Positive for sinus pressure and sneezing. Negative for ear discharge and ear pain.        L ear pressure off on x 1 month   Rest is negative  Today's Vitals   04/21/18 1426 04/21/18 1503  BP: (!) 160/98 140/85  Pulse: 73   Temp: 98.2 F (36.8 C)   TempSrc: Oral   SpO2: 98%   Weight: 196 lb 3.2 oz (89 kg)   Height: 5' 3.6" (1.615 m)    Body mass index is 34.1 kg/m.   Objective:  Physical Exam   BP 140/85   Pulse 73   Temp 98.2 F (36.8 C) (Oral)   Ht 5' 3.6" (1.615 m)   Wt 196 lb 3.2 oz (89 kg)   LMP 03/27/2018   SpO2 98%   BMI 34.10 kg/m   General Appearance:    Alert, cooperative, no distress, appears stated age  Head:    Normocephalic, without obvious abnormality, atraumatic  Eyes:    PERRL, conjunctiva/corneas clear, EOM's intact, fundi    benign, both eyes  Ears:    Normal TM's and external ear canals, both ears  Nose:   Nares normal, septum midline, mucosa normal, no drainage    or sinus tenderness  Throat:   Lips, mucosa, and tongue normal; teeth and gums normal  Neck:   Supple, symmetrical, trachea midline, no adenopathy;    thyroid:  no enlargement/tenderness/nodules; no carotid   bruit or JVD  Back:     Symmetric, no curvature, ROM  normal, no CVA tenderness  Lungs:     Clear to auscultation bilaterally, respirations unlabored  Chest Wall:    No tenderness or deformity   Heart:    Regular rate and rhythm, S1 and S2 normal, no murmur, rub   or gallop  Breast Exam:    No tenderness, masses, or nipple abnormality  Abdomen:     Soft, non-tender, bowel sounds active all four quadrants,    no masses, no organomegaly  Genitalia:    Normal female without lesion, discharge or tenderness  Rectal:    Normal tone, normal prostate, no masses or tenderness;   guaiac negative stool  Extremities:   Extremities normal, atraumatic, no cyanosis or edema  Pulses:   2+ and symmetric all extremities  Skin:   Skin color, texture, turgor normal, no rashes or lesions  Lymph nodes:   Cervical, supraclavicular, and axillary nodes normal  Neurologic:   CNII-XII intact, normal strength, sensation and reflexes    Throughout. Normal Romberg, tandem gait, tip toe and heel  gait, finger to nose.    Assessment And Plan:    1. Elevated blood pressure reading- new. Advised to do BO diaries and sent me a Mychart message if 40/90 > I may need to place her on medication. Needs to avoid high sodium foods. FU in 3 months for BP and weight check.  - POCT Urinalysis Dipstick (81002) - CBC no Diff - Lipid Profile  2. Obesity (BMI 30.0-34.9)- chronic - Lipid Profile - TSH - T4, Free - T3, free  3. Abnormal glucose- used to have it during pregnancy. Unknown status.  - Hemoglobin A1c  4. Encounter for general adult medical examination with abnormal findings- routine.  - Lipid Profile  5. Non-recurrent acute serous otitis media, unspecified laterality- Acute. Placed on Flonase and advised to continue Claritin.     Selyna Klahn RODRIGUEZ-SOUTHWORTH, PA-C

## 2018-04-22 ENCOUNTER — Ambulatory Visit: Payer: Self-pay | Admitting: Nurse Practitioner

## 2018-04-22 LAB — CBC
HEMATOCRIT: 42.3 % (ref 34.0–46.6)
HEMOGLOBIN: 14.3 g/dL (ref 11.1–15.9)
MCH: 32 pg (ref 26.6–33.0)
MCHC: 33.8 g/dL (ref 31.5–35.7)
MCV: 95 fL (ref 79–97)
Platelets: 386 10*3/uL (ref 150–450)
RBC: 4.47 x10E6/uL (ref 3.77–5.28)
RDW: 11 % — ABNORMAL LOW (ref 11.7–15.4)
WBC: 5.8 10*3/uL (ref 3.4–10.8)

## 2018-04-22 LAB — TSH: TSH: 2.94 u[IU]/mL (ref 0.450–4.500)

## 2018-04-22 LAB — LIPID PANEL
CHOL/HDL RATIO: 2.9 ratio (ref 0.0–4.4)
Cholesterol, Total: 141 mg/dL (ref 100–199)
HDL: 49 mg/dL (ref >39)
LDL Calculated: 79 mg/dL (ref 0–99)
TRIGLYCERIDES: 64 mg/dL (ref 0–149)
VLDL CHOLESTEROL CAL: 13 mg/dL (ref 5–40)

## 2018-04-22 LAB — HEMOGLOBIN A1C
Est. average glucose Bld gHb Est-mCnc: 97 mg/dL
HEMOGLOBIN A1C: 5 % (ref 4.8–5.6)

## 2018-04-22 LAB — T4, FREE: Free T4: 1.23 ng/dL (ref 0.82–1.77)

## 2018-04-22 LAB — T3, FREE: T3, Free: 3.2 pg/mL (ref 2.0–4.4)

## 2018-04-28 ENCOUNTER — Other Ambulatory Visit: Payer: Self-pay | Admitting: Internal Medicine

## 2018-04-28 ENCOUNTER — Ambulatory Visit: Payer: BLUE CROSS/BLUE SHIELD | Admitting: Internal Medicine

## 2018-04-28 DIAGNOSIS — J309 Allergic rhinitis, unspecified: Secondary | ICD-10-CM | POA: Insufficient documentation

## 2018-04-28 MED ORDER — ALBUTEROL SULFATE HFA 108 (90 BASE) MCG/ACT IN AERS: 2.0000 | INHALATION_SPRAY | Freq: Four times a day (QID) | RESPIRATORY_TRACT | 0 refills | Status: DC | PRN

## 2018-04-28 MED ORDER — ALBUTEROL SULFATE HFA 108 (90 BASE) MCG/ACT IN AERS
2.0000 | INHALATION_SPRAY | Freq: Four times a day (QID) | RESPIRATORY_TRACT | 0 refills | Status: DC | PRN
Start: 2018-04-28 — End: 2018-04-28

## 2018-05-02 ENCOUNTER — Ambulatory Visit: Payer: BLUE CROSS/BLUE SHIELD | Admitting: Nurse Practitioner

## 2018-06-02 ENCOUNTER — Encounter: Payer: Self-pay | Admitting: Internal Medicine

## 2018-07-27 ENCOUNTER — Telehealth: Payer: Self-pay | Admitting: Internal Medicine

## 2018-07-27 NOTE — Telephone Encounter (Signed)
ATT TO CONTACT PT TO ADVISE THAT APPT CANCELLED FOR 7/2 DUE TO PROVIDER OUT OF OFC NO ANS UNABLE TO LVM MAILBOX FULL

## 2018-07-28 ENCOUNTER — Ambulatory Visit: Payer: BLUE CROSS/BLUE SHIELD | Admitting: Internal Medicine

## 2019-05-20 ENCOUNTER — Ambulatory Visit: Payer: PRIVATE HEALTH INSURANCE

## 2019-08-16 DIAGNOSIS — Z23 Encounter for immunization: Secondary | ICD-10-CM | POA: Diagnosis not present

## 2019-09-06 DIAGNOSIS — Z23 Encounter for immunization: Secondary | ICD-10-CM | POA: Diagnosis not present

## 2019-10-27 DIAGNOSIS — Z419 Encounter for procedure for purposes other than remedying health state, unspecified: Secondary | ICD-10-CM | POA: Diagnosis not present

## 2019-11-27 DIAGNOSIS — Z419 Encounter for procedure for purposes other than remedying health state, unspecified: Secondary | ICD-10-CM | POA: Diagnosis not present

## 2019-12-05 ENCOUNTER — Other Ambulatory Visit: Payer: Self-pay

## 2019-12-05 ENCOUNTER — Ambulatory Visit: Payer: BLUE CROSS/BLUE SHIELD | Admitting: Nurse Practitioner

## 2019-12-05 ENCOUNTER — Encounter: Payer: Self-pay | Admitting: Nurse Practitioner

## 2019-12-05 ENCOUNTER — Other Ambulatory Visit (HOSPITAL_COMMUNITY)
Admission: RE | Admit: 2019-12-05 | Discharge: 2019-12-05 | Disposition: A | Discharge status: Home / Self Care | Payer: PRIVATE HEALTH INSURANCE | Source: Ambulatory Visit | Attending: Nurse Practitioner | Admitting: Nurse Practitioner

## 2019-12-05 VITALS — BP 114/80 | HR 68 | Temp 98.4°F | Ht 65.4 in | Wt 227.4 lb

## 2019-12-05 DIAGNOSIS — E6609 Other obesity due to excess calories: Secondary | ICD-10-CM | POA: Diagnosis not present

## 2019-12-05 DIAGNOSIS — A630 Anogenital (venereal) warts: Secondary | ICD-10-CM | POA: Diagnosis not present

## 2019-12-05 DIAGNOSIS — Z1159 Encounter for screening for other viral diseases: Secondary | ICD-10-CM

## 2019-12-05 DIAGNOSIS — Z113 Encounter for screening for infections with a predominantly sexual mode of transmission: Secondary | ICD-10-CM

## 2019-12-05 DIAGNOSIS — Z124 Encounter for screening for malignant neoplasm of cervix: Secondary | ICD-10-CM | POA: Diagnosis not present

## 2019-12-05 DIAGNOSIS — Z0000 Encounter for general adult medical examination without abnormal findings: Secondary | ICD-10-CM | POA: Diagnosis not present

## 2019-12-05 DIAGNOSIS — Z6837 Body mass index (BMI) 37.0-37.9, adult: Secondary | ICD-10-CM

## 2019-12-05 DIAGNOSIS — Z1231 Encounter for screening mammogram for malignant neoplasm of breast: Secondary | ICD-10-CM | POA: Diagnosis not present

## 2019-12-05 DIAGNOSIS — R350 Frequency of micturition: Secondary | ICD-10-CM

## 2019-12-05 DIAGNOSIS — Z833 Family history of diabetes mellitus: Secondary | ICD-10-CM

## 2019-12-05 NOTE — Progress Notes (Signed)
I,Yamilka Roman [employer] as a Education administrator for [employer], FNP.,have documented all relevant documentation on the behalf of [name], FNP,as directed by  [name], FNP while in the presence of [name], [place]. This visit occurred during the SARS-CoV-2 public health emergency.  Safety protocols were in place, including screening questions prior to the visit, additional usage of staff PPE, and extensive cleaning of exam room while observing appropriate contact time as indicated for disinfecting solutions.  Subjective:     Patient ID: Alicia Perry , female    DOB: 12-Apr-1980 , 39 y.o.   MRN: [id number]   Chief Complaint  Patient presents with  . Annual Exam    HPI  Here for HM.   LMP - 11/17/2019    Past Medical History:  Diagnosis Date  . No pertinent past medical history      Family History  Problem Relation Age of Onset  . Diabetes Father   . Hypertension Father   . Heart failure Father      Current Outpatient Medications:  .  albuterol (PROVENTIL HFA;VENTOLIN HFA) 108 (90 Base) MCG/ACT inhaler, Inhale 2 puffs into the lungs every 6 (six) hours as needed for wheezing or shortness of breath., Disp: 1 Inhaler, Rfl: 0   No Known Allergies    The patient states she uses none for birth control.  Patient's last menstrual period was 11/17/2019.. Negative for Dysmenorrhea and Negative for Menorrhagia. Negative for: breast discharge, breast lump(s), breast pain and breast self exam. Associated symptoms include abnormal vaginal bleeding. Pertinent negatives include abnormal bleeding (hematology), anxiety, decreased libido, depression, difficulty falling sleep, dyspareunia, history of infertility, nocturia, sexual dysfunction, sleep disturbances, urinary incontinence, urinary urgency, vaginal discharge and vaginal itching. Diet regular; she admits to eating increased breads and sodas.  The patient states her exercise level is none.  She verbalizes she is her own barrier.     The patient's tobacco use is:  Social History   Tobacco Use  Smoking Status Current Every Day Smoker  . Packs/day: 0.75  . Years: 19.00  . Pack years: 14.25  Smokeless Tobacco Never Used  Tobacco Comment   She quit while she was pregnant    She has been exposed to passive smoke. The patient's alcohol use is:  Social History   Substance and Sexual Activity  Alcohol Use Yes   Comment: socially   Additional information: Last pap she thinks was in 2017, next one scheduled for today.  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   Review of Systems  Constitutional: Negative.   HENT: Negative.   Eyes: Negative.   Respiratory: Negative.   Cardiovascular: Negative.   Gastrointestinal: Negative.   Endocrine: Negative.   Genitourinary: Negative.   Musculoskeletal: Negative.   Skin: Negative.   Allergic/Immunologic: Negative.   Neurological: Negative.   Hematological: Negative.   Psychiatric/Behavioral: Negative.      Today's Vitals   12/05/19 0831  BP: 114/80  Pulse: 68  Temp: 98.4 F (36.9 C)  TempSrc: Oral  Weight: 227 lb 6.4 oz (103.1 kg)  Height: 5' 5.4" (1.661 m)  PainSc: 0-No pain   Body mass index is 37.38 kg/m.   Objective:  Physical Exam Constitutional:      General: She is not in acute distress.    Appearance: Normal appearance. She is well-developed. She is obese.  HENT:     Head: Normocephalic and atraumatic.  Right Ear: Hearing, tympanic membrane, ear canal and external ear normal. There is no impacted cerumen.     Left Ear: Hearing, tympanic membrane, ear canal and external ear normal. There is no impacted cerumen.     Nose:     Comments: Deferred - masked    Mouth/Throat:     Comments: Deferred - masked Eyes:     General: Lids are normal.     Extraocular Movements: Extraocular movements intact.     Conjunctiva/sclera: Conjunctivae normal.     Pupils: Pupils are equal,  round, and reactive to light.     Funduscopic exam:    Right eye: No papilledema.        Left eye: No papilledema.  Neck:     Thyroid: No thyroid mass.     Vascular: No carotid bruit.  Cardiovascular:     Rate and Rhythm: Normal rate and regular rhythm.     Pulses: Normal pulses.     Heart sounds: Normal heart sounds. No murmur heard.   Pulmonary:     Effort: Pulmonary effort is normal. No respiratory distress.     Breath sounds: Normal breath sounds. No wheezing.  Chest:     Chest wall: No mass.     Breasts: Tanner Score is 5.        Right: Normal. No mass or tenderness.        Left: Normal. No mass or tenderness.  Abdominal:     General: Abdomen is flat. Bowel sounds are normal. There is no distension.     Palpations: Abdomen is soft.     Tenderness: There is no abdominal tenderness.  Genitourinary:    Vagina: Normal. No vaginal discharge or tenderness.     Uterus: Normal.      Adnexa: Right adnexa normal and left adnexa normal.       Right: No mass or tenderness.         Left: No mass or tenderness.       Rectum: Guaiac result negative.       Comments: Multiple warts present.  Cervix points posteriorly Musculoskeletal:        General: No swelling or tenderness. Normal range of motion.     Cervical back: Full passive range of motion without pain, normal range of motion and neck supple.     Right lower leg: No edema.     Left lower leg: No edema.  Lymphadenopathy:     Upper Body:     Right upper body: No supraclavicular, axillary or pectoral adenopathy.     Left upper body: No supraclavicular, axillary or pectoral adenopathy.  Skin:    General: Skin is warm and dry.     Capillary Refill: Capillary refill takes less than 2 seconds.  Neurological:     General: No focal deficit present.     Mental Status: She is alert and oriented to person, place, and time.     Cranial Nerves: No cranial nerve deficit.     Sensory: No sensory deficit.  Psychiatric:        Mood and  Affect: Mood normal.        Behavior: Behavior normal.        Thought Content: Thought content normal.        Judgment: Judgment normal.         Assessment And Plan:     1. Encounter for general adult medical examination w/o abnormal findings . Behavior modifications discussed and diet history reviewed.   Marland Kitchen  Pt will continue to exercise regularly and modify diet with low GI, plant based foods and decrease intake of processed foods.  . Recommend intake of daily multivitamin, Vitamin D, and calcium.  . Recommend mammogram (I will do a screening baseline mammogram) for preventive screenings, as well as recommend immunizations that include influenza, TDAP (she will check with her employer about her TDAP) - CMP14+EGFR - CBC  2. Genital warts  She has warts present to her anterior vaginal area and near her buttocks  She has had treatment previously about 15 years ago, she is now having pain I will refer to GYN for further evaluation - Ambulatory referral to Obstetrics / Gynecology  3. 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 Encounter for screening mammogram for malignant neoplasm of breast  Pt instructed on Self Breast Exam.According to ACOG guidelines Women aged 74 and older are recommended to get an annual mammogram. Form completed and given to patient contact the [facility] for appointment scheduing.   Pt encouraged to get annual mammogram  Discussed performing self breast exams - MM DIGITAL SCREENING BILATERAL; Future  5. Encounter for hepatitis C screening test for low risk patient  Will check Hepatitis C screening due to recent recommendations to screen all adults 18 years and older - Hepatitis C antibody  6. Urinary frequency  Will  check HgbA1c   Also there is a family history of diabetes - Hemoglobin A1c  7. Family history of diabetes mellitus - Hemoglobin A1c     Patient was given opportunity to ask questions. Patient verbalized understanding of the plan and was able to repeat key elements of the plan. All questions were answered to their satisfaction.    Teola Bradley, FNP, have reviewed all documentation for this visit. The documentation on 12/05/19 for the exam, diagnosis, procedures, and orders are all accurate and complete.   THE PATIENT IS ENCOURAGED TO PRACTICE SOCIAL DISTANCING DUE TO THE COVID-19 PANDEMIC.

## 2019-12-05 NOTE — Patient Instructions (Addendum)
Health Maintenance, Female Adopting a healthy lifestyle and getting preventive care are important in promoting health and wellness. Ask your health care provider about:  The right schedule for you to have regular tests and exams.  Things you can do on your own to prevent diseases and keep yourself healthy. What should I know about diet, weight, and exercise? Eat a healthy diet   Eat a diet that includes plenty of vegetables, fruits, low-fat dairy products, and lean protein.  Do not eat a lot of foods that are high in solid fats, added sugars, or sodium. Maintain a healthy weight Body mass index (BMI) is used to identify weight problems. It estimates body fat based on height and weight. Your health care provider can help determine your BMI and help you achieve or maintain a healthy weight. Get regular exercise Get regular exercise. This is one of the most important things you can do for your health. Most adults should:  Exercise for at least 150 minutes each week. The exercise should increase your heart rate and make you sweat (moderate-intensity exercise).  Do strengthening exercises at least twice a week. This is in addition to the moderate-intensity exercise.  Spend less time sitting. Even light physical activity can be beneficial. Watch cholesterol and blood lipids Have your blood tested for lipids and cholesterol at 38 years of age, then have this test every 5 years. Have your cholesterol levels checked more often if:  Your lipid or cholesterol levels are high.  You are older than 38 years of age.  You are at high risk for heart disease. What should I know about cancer screening? Depending on your health history and family history, you may need to have cancer screening at various ages. This may include screening for:  Breast cancer.  Cervical cancer.  Colorectal cancer.  Skin cancer.  Lung cancer. What should I know about heart disease, diabetes, and high blood  pressure? Blood pressure and heart disease  High blood pressure causes heart disease and increases the risk of stroke. This is more likely to develop in people who have high blood pressure readings, are of African descent, or are overweight.  Have your blood pressure checked: ? Every 3-5 years if you are 18-39 years of age. ? Every year if you are 40 years old or older. Diabetes Have regular diabetes screenings. This checks your fasting blood sugar level. Have the screening done:  Once every three years after age 40 if you are at a normal weight and have a low risk for diabetes.  More often and at a younger age if you are overweight or have a high risk for diabetes. What should I know about preventing infection? Hepatitis B If you have a higher risk for hepatitis B, you should be screened for this virus. Talk with your health care provider to find out if you are at risk for hepatitis B infection. Hepatitis C Testing is recommended for:  Everyone born from 1945 through 1965.  Anyone with known risk factors for hepatitis C. Sexually transmitted infections (STIs)  Get screened for STIs, including gonorrhea and chlamydia, if: ? You are sexually active and are younger than 38 years of age. ? You are older than 38 years of age and your health care provider tells you that you are at risk for this type of infection. ? Your sexual activity has changed since you were last screened, and you are at increased risk for chlamydia or gonorrhea. Ask your health care provider if   you are at risk.  Ask your health care provider about whether you are at high risk for HIV. Your health care provider may recommend a prescription medicine to help prevent HIV infection. If you choose to take medicine to prevent HIV, you should first get tested for HIV. You should then be tested every 3 months for as long as you are taking the medicine. Pregnancy  If you are about to stop having your period (premenopausal) and  you may become pregnant, seek counseling before you get pregnant.  Take 400 to 800 micrograms (mcg) of folic acid every day if you become pregnant.  Ask for birth control (contraception) if you want to prevent pregnancy. Osteoporosis and menopause Osteoporosis is a disease in which the bones lose minerals and strength with aging. This can result in bone fractures. If you are 65 years old or older, or if you are at risk for osteoporosis and fractures, ask your health care provider if you should:  Be screened for bone loss.  Take a calcium or vitamin D supplement to lower your risk of fractures.  Be given hormone replacement therapy (HRT) to treat symptoms of menopause. Follow these instructions at home: Lifestyle  Do not use any products that contain nicotine or tobacco, such as cigarettes, e-cigarettes, and chewing tobacco. If you need help quitting, ask your health care provider.  Do not use street drugs.  Do not share needles.  Ask your health care provider for help if you need support or information about quitting drugs. Alcohol use  Do not drink alcohol if: ? Your health care provider tells you not to drink. ? You are pregnant, may be pregnant, or are planning to become pregnant.  If you drink alcohol: ? Limit how much you use to 0-1 drink a day. ? Limit intake if you are breastfeeding.  Be aware of how much alcohol is in your drink. In the [place] one drink equals one 12 oz bottle of beer (355 mL), one 5 oz glass of wine (148 mL), or one 1 oz glass of hard liquor (44 mL). General instructions  Schedule regular health, dental, and eye exams.  Stay current with your vaccines.  Tell your health care provider if: ? You often feel depressed. ? You have ever been abused or do not feel safe at home. Summary  Adopting a healthy lifestyle and getting preventive care are important in promoting health and wellness.  Follow your health care provider's instructions about healthy  diet, exercising, and getting tested or screened for diseases.  Follow your health care provider's instructions on monitoring your cholesterol and blood pressure. This information is not intended to replace advice given to you by your health care provider. Make sure you discuss any questions you have with your health care provider. Document Revised: 01/05/2018 Document Reviewed: 01/05/2018 Elsevier Patient Education  2020 Elsevier Inc.  Exercising to Lose Weight Exercise is structured, repetitive physical activity to improve fitness and health. Getting regular exercise is important for everyone. It is especially important if you are overweight. Being overweight increases your risk of heart disease, stroke, diabetes, high blood pressure, and several types of cancer. Reducing your calorie intake and exercising can help you lose weight. Exercise is usually categorized as moderate or vigorous intensity. To lose weight, most people need to do a certain amount of moderate-intensity or vigorous-intensity exercise each week. Moderate-intensity exercise  Moderate-intensity exercise is any activity that gets you moving enough to burn at least three times more energy (calories)   than if you were sitting. Examples of moderate exercise include:  Walking a mile in 15 minutes.  Doing light yard work.  Biking at an easy pace. Most people should get at least 150 minutes (2 hours and 30 minutes) a week of moderate-intensity exercise to maintain their body weight. Vigorous-intensity exercise Vigorous-intensity exercise is any activity that gets you moving enough to burn at least six times more calories than if you were sitting. When you exercise at this intensity, you should be working hard enough that you are not able to carry on a conversation. Examples of vigorous exercise include:  Running.  Playing a team sport, such as football, basketball, and soccer.  Jumping rope. Most people should get at least  75 minutes (1 hour and 15 minutes) a week of vigorous-intensity exercise to maintain their body weight. How can exercise affect me? When you exercise enough to burn more calories than you eat, you lose weight. Exercise also reduces body fat and builds muscle. The more muscle you have, the more calories you burn. Exercise also:  Improves mood.  Reduces stress and tension.  Improves your overall fitness, flexibility, and endurance.  Increases bone strength. The amount of exercise you need to lose weight depends on:  Your age.  The type of exercise.  Any health conditions you have.  Your overall physical ability. Talk to your health care provider about how much exercise you need and what types of activities are safe for you. What actions can I take to lose weight? Nutrition   Make changes to your diet as told by your health care provider or diet and nutrition specialist (dietitian). This may include: ? Eating fewer calories. ? Eating more protein. ? Eating less unhealthy fats. ? Eating a diet that includes fresh fruits and vegetables, whole grains, low-fat dairy products, and lean protein. ? Avoiding foods with added fat, salt, and sugar.  Drink plenty of water while you exercise to prevent dehydration or heat stroke. Activity  Choose an activity that you enjoy and set realistic goals. Your health care provider can help you make an exercise plan that works for you.  Exercise at a moderate or vigorous intensity most days of the week. ? The intensity of exercise may vary from person to person. You can tell how intense a workout is for you by paying attention to your breathing and heartbeat. Most people will notice their breathing and heartbeat get faster with more intense exercise.  Do resistance training twice each week, such as: ? Push-ups. ? Sit-ups. ? Lifting weights. ? Using resistance bands.  Getting short amounts of exercise can be just as helpful as long structured  periods of exercise. If you have trouble finding time to exercise, try to include exercise in your daily routine. ? Get up, stretch, and walk around every 30 minutes throughout the day. ? Go for a walk during your lunch break. ? Park your car farther away from your destination. ? If you take public transportation, get off one stop early and walk the rest of the way. ? Make phone calls while standing up and walking around. ? Take the stairs instead of elevators or escalators.  Wear comfortable clothes and shoes with good support.  Do not exercise so much that you hurt yourself, feel dizzy, or get very short of breath. Where to find more information  [place] Department of Health and Human Services: www.hhs.gov  Centers for Disease Control and Prevention (CDC): www.cdc.gov Contact a health care provider:    Before starting a new exercise program.  If you have questions or concerns about your weight.  If you have a medical problem that keeps you from exercising. Get help right away if you have any of the following while exercising:  Injury.  Dizziness.  Difficulty breathing or shortness of breath that does not go away when you stop exercising.  Chest pain.  Rapid heartbeat. Summary  Being overweight increases your risk of heart disease, stroke, diabetes, high blood pressure, and several types of cancer.  Losing weight happens when you burn more calories than you eat.  Reducing the amount of calories you eat in addition to getting regular moderate or vigorous exercise each week helps you lose weight. This information is not intended to replace advice given to you by your health care provider. Make sure you discuss any questions you have with your health care provider. Document Revised: 01/25/2017 Document Reviewed: 01/25/2017 Elsevier Patient Education  2020 Elsevier Inc.   

## 2019-12-06 ENCOUNTER — Other Ambulatory Visit: Payer: Self-pay | Admitting: Nurse Practitioner

## 2019-12-06 LAB — CERVICOVAGINAL ANCILLARY ONLY
Bacterial Vaginitis (gardnerella): POSITIVE — AB
Candida Glabrata: NEGATIVE
Candida Vaginitis: NEGATIVE
Chlamydia: NEGATIVE
Comment: NEGATIVE
Comment: NEGATIVE
Comment: NEGATIVE
Comment: NEGATIVE
Comment: NEGATIVE
Comment: NORMAL
Neisseria Gonorrhea: NEGATIVE
Trichomonas: NEGATIVE

## 2019-12-06 LAB — CYTOLOGY - PAP: Diagnosis: NEGATIVE

## 2019-12-06 MED ORDER — METRONIDAZOLE 500 MG PO TABS: 500.0000 mg | ORAL_TABLET | Freq: Three times a day (TID) | ORAL | 0 refills | Status: AC

## 2019-12-18 ENCOUNTER — Other Ambulatory Visit: Payer: Self-pay

## 2019-12-18 MED ORDER — FLUCONAZOLE 150 MG PO TABS: ORAL_TABLET | ORAL | 0 refills | Status: DC

## 2019-12-19 ENCOUNTER — Other Ambulatory Visit: Payer: Self-pay

## 2019-12-19 ENCOUNTER — Other Ambulatory Visit: Payer: Medicaid Other

## 2019-12-19 DIAGNOSIS — Z0000 Encounter for general adult medical examination without abnormal findings: Secondary | ICD-10-CM | POA: Diagnosis not present

## 2019-12-19 DIAGNOSIS — Z1159 Encounter for screening for other viral diseases: Secondary | ICD-10-CM | POA: Diagnosis not present

## 2019-12-19 DIAGNOSIS — R350 Frequency of micturition: Secondary | ICD-10-CM | POA: Diagnosis not present

## 2019-12-19 DIAGNOSIS — Z113 Encounter for screening for infections with a predominantly sexual mode of transmission: Secondary | ICD-10-CM | POA: Diagnosis not present

## 2019-12-19 DIAGNOSIS — Z833 Family history of diabetes mellitus: Secondary | ICD-10-CM | POA: Diagnosis not present

## 2019-12-20 LAB — CMP14+EGFR
ALT: 11 IU/L (ref 0–32)
AST: 12 IU/L (ref 0–40)
Albumin/Globulin Ratio: 1.4 (ref 1.2–2.2)
Albumin: 4.1 g/dL (ref 3.8–4.8)
Alkaline Phosphatase: 80 IU/L (ref 44–121)
BUN/Creatinine Ratio: 6 — ABNORMAL LOW (ref 9–23)
BUN: 5 mg/dL — ABNORMAL LOW (ref 6–20)
Bilirubin Total: 0.2 mg/dL (ref 0.0–1.2)
CO2: 23 mmol/L (ref 20–29)
Calcium: 9 mg/dL (ref 8.7–10.2)
Chloride: 103 mmol/L (ref 96–106)
Creatinine, Ser: 0.9 mg/dL (ref 0.57–1.00)
GFR calc Af Amer: 94 mL/min/{1.73_m2} (ref >59)
GFR calc non Af Amer: 81 mL/min/{1.73_m2} (ref >59)
Globulin, Total: 2.9 g/dL (ref 1.5–4.5)
Glucose: 126 mg/dL — ABNORMAL HIGH (ref 65–99)
Potassium: 3.8 mmol/L (ref 3.5–5.2)
Sodium: 139 mmol/L (ref 134–144)
Total Protein: 7 g/dL (ref 6.0–8.5)

## 2019-12-20 LAB — CBC
Hematocrit: 39.2 % (ref 34.0–46.6)
Hemoglobin: 13.7 g/dL (ref 11.1–15.9)
MCH: 32.6 pg (ref 26.6–33.0)
MCHC: 34.9 g/dL (ref 31.5–35.7)
MCV: 93 fL (ref 79–97)
Platelets: 371 10*3/uL (ref 150–450)
RBC: 4.2 x10E6/uL (ref 3.77–5.28)
RDW: 10.8 % — ABNORMAL LOW (ref 11.7–15.4)
WBC: 6.2 10*3/uL (ref 3.4–10.8)

## 2019-12-20 LAB — HEPATITIS C ANTIBODY: Hep C Virus Ab: <0.1 s/co ratio (ref 0.0–0.9)

## 2019-12-20 LAB — T PALLIDUM SCREENING CASCADE: T pallidum Antibodies (TP-PA): NONREACTIVE

## 2019-12-20 LAB — HEMOGLOBIN A1C
Est. average glucose Bld gHb Est-mCnc: 103 mg/dL
Hgb A1c MFr Bld: 5.2 % (ref 4.8–5.6)

## 2019-12-20 LAB — HIV ANTIBODY (ROUTINE TESTING W REFLEX): HIV Screen 4th Generation wRfx: NONREACTIVE

## 2019-12-27 DIAGNOSIS — Z419 Encounter for procedure for purposes other than remedying health state, unspecified: Secondary | ICD-10-CM | POA: Diagnosis not present

## 2020-01-27 DIAGNOSIS — Z419 Encounter for procedure for purposes other than remedying health state, unspecified: Secondary | ICD-10-CM | POA: Diagnosis not present

## 2020-02-15 ENCOUNTER — Encounter: Payer: Self-pay | Admitting: Student

## 2020-02-15 ENCOUNTER — Other Ambulatory Visit: Payer: Self-pay

## 2020-02-15 ENCOUNTER — Ambulatory Visit (INDEPENDENT_AMBULATORY_CARE_PROVIDER_SITE_OTHER): Payer: Medicaid Other | Admitting: Student

## 2020-02-15 VITALS — BP 153/84 | HR 89 | Ht 63.0 in | Wt 222.8 lb

## 2020-02-15 DIAGNOSIS — A630 Anogenital (venereal) warts: Secondary | ICD-10-CM

## 2020-02-15 MED ORDER — IMIQUIMOD 5 % EX CREA: TOPICAL_CREAM | CUTANEOUS | 2 refills | Status: DC

## 2020-02-15 NOTE — Progress Notes (Signed)
-   6 months,  -aldara, instructions given -   History:  Alicia Perry is a 40 y.o. G2P1011 who presents to clinic today for genital warts. She reports that 6 months ago she noticed warts on her periurethral area and near her rectum. She feels that they are getting larger. She denies any other complaints.   The following portions of the patient's history were reviewed and updated as appropriate: allergies, current medications, family history, past medical history, social history, past surgical history and problem list.  Review of Systems:  Review of Systems  Constitutional: Negative.   HENT: Negative.   Respiratory: Negative.   Cardiovascular: Negative.   Skin: Negative.   Feels "bumps" on her labia and near her rectum    Objective:  Physical Exam BP (!) 153/84 (BP Location: Right Arm)   Pulse 89   Ht 5\' 3"  (1.6 m)   Wt 222 lb 12.8 oz (101.1 kg)   LMP 01/19/2020 (Approximate)   BMI 39.47 kg/m  Physical Exam Constitutional:      Appearance: Normal appearance.  Cardiovascular:     Rate and Rhythm: Normal rate.  Genitourinary:    General: Normal vulva.       Comments: Three small genital warts noted above clitoral hood, as well as 4 small warts on ana sphincter. Skin tag from hemrroids noted.  Neurological:     Mental Status: She is alert.       Labs and Imaging No results found for this or any previous visit (from the past 24 hour(s)).  No results found.   Assessment & Plan:    1. Genital warts   -Consult with Dr. 01/21/2020; recommended Aldara to treat; apply sparingly and wash off after 8 hours. Treat every other day until warts are gone.  -Patient given coupon for [employer]Rx to help with payment.   Approximately 15 minutes of total time was spent with this patient on counseling and exam.   Alicia Perry, CNM 02/16/2020 8:53 AM

## 2020-02-27 DIAGNOSIS — Z419 Encounter for procedure for purposes other than remedying health state, unspecified: Secondary | ICD-10-CM | POA: Diagnosis not present

## 2020-03-26 DIAGNOSIS — Z419 Encounter for procedure for purposes other than remedying health state, unspecified: Secondary | ICD-10-CM | POA: Diagnosis not present

## 2020-04-26 DIAGNOSIS — Z419 Encounter for procedure for purposes other than remedying health state, unspecified: Secondary | ICD-10-CM | POA: Diagnosis not present

## 2020-05-07 ENCOUNTER — Other Ambulatory Visit: Payer: Self-pay

## 2020-05-07 ENCOUNTER — Ambulatory Visit (INDEPENDENT_AMBULATORY_CARE_PROVIDER_SITE_OTHER): Payer: Medicaid Other | Admitting: Nurse Practitioner

## 2020-05-07 ENCOUNTER — Encounter: Payer: Self-pay | Admitting: Nurse Practitioner

## 2020-05-07 VITALS — BP 124/80 | HR 84 | Temp 98.5°F | Ht 63.0 in | Wt 225.6 lb

## 2020-05-07 DIAGNOSIS — K610 Anal abscess: Secondary | ICD-10-CM

## 2020-05-07 MED ORDER — CEPHALEXIN 500 MG PO CAPS: 500.0000 mg | ORAL_CAPSULE | Freq: Four times a day (QID) | ORAL | 0 refills | Status: AC

## 2020-05-07 MED ORDER — ALBUTEROL SULFATE HFA 108 (90 BASE) MCG/ACT IN AERS: 2.0000 | INHALATION_SPRAY | Freq: Four times a day (QID) | RESPIRATORY_TRACT | 0 refills | Status: DC | PRN

## 2020-05-07 NOTE — Progress Notes (Signed)
I,Emberlee Sortino,acting as a Neurosurgeon for Arnette Felts, FNP.,have documented all relevant documentation on the behalf of Arnette Felts, FNP,as directed by  Arnette Felts, FNP while in the presence of Arnette Felts, FNP. This visit occurred during the SARS-CoV-2 public health emergency.  Safety protocols were in place, including screening questions prior to the visit, additional usage of staff PPE, and extensive cleaning of exam room while observing appropriate contact time as indicated for disinfecting solutions.  Subjective:     Patient ID: Alicia Perry , female    DOB: 1980-02-01 , 40 y.o.   MRN: [id number]   Chief Complaint  Patient presents with  . Recurrent Skin Infections    HPI  Pt is here for a boil on her lower back. Was there 2 weeks ago and went away but has returned. It did open up. Had som mild pain.     Past Medical History:  Diagnosis Date  . No pertinent past medical history      Family History  Problem Relation Age of Onset  . Diabetes Father   . Hypertension Father   . Heart failure Father      Current Outpatient Medications:  .  cephALEXin (KEFLEX) 500 MG capsule, Take 1 capsule (500 mg total) by mouth 4 (four) times daily for 10 days., Disp: 40 capsule, Rfl: 0 .  fluconazole (DIFLUCAN) 150 MG tablet, Take one tablet by mouth, Disp: 1 tablet, Rfl: 0 .  imiquimod (ALDARA) 5 % cream, Apply topically 3 (three) times a week. Apply at night; wash off after 8 hours. Apply carefully around rectum. Discontinue use once warts are gone., Disp: 12 each, Rfl: 2 .  albuterol (VENTOLIN HFA) 108 (90 Base) MCG/ACT inhaler, Inhale 2 puffs into the lungs every 6 (six) hours as needed for wheezing or shortness of breath., Disp: 1 each, Rfl: 0   No Known Allergies   Review of Systems  Constitutional: Negative.   HENT: Negative.   Eyes: Negative.   Respiratory: Negative.   Cardiovascular: Negative.  Negative for chest pain, palpitations and leg swelling.  Gastrointestinal:  Negative.   Endocrine: Negative.   Genitourinary: Negative.   Musculoskeletal: Negative.   Skin: Negative.        Boil to top of buttocks  Allergic/Immunologic: Negative.   Neurological: Negative.   Hematological: Negative.   Psychiatric/Behavioral: Negative.      Today's Vitals   05/07/20 1612  BP: 124/80  Pulse: 84  Temp: 98.5 F (36.9 C)  Weight: 225 lb 9.6 oz (102.3 kg)  Height: 5\' 3"  (1.6 m)  PainSc: 0-No pain   Body mass index is 39.96 kg/m.  Wt Readings from Last 3 Encounters:  05/07/20 225 lb 9.6 oz (102.3 kg)  02/15/20 222 lb 12.8 oz (101.1 kg)  12/05/19 227 lb 6.4 oz (103.1 kg)   Objective:  Physical Exam Constitutional:      General: She is not in acute distress.    Appearance: Normal appearance. She is obese. She is not ill-appearing.  Cardiovascular:     Rate and Rhythm: Normal rate and regular rhythm.     Pulses: Normal pulses.     Heart sounds: Normal heart sounds. No murmur heard.   Pulmonary:     Effort: Pulmonary effort is normal. No respiratory distress.     Breath sounds: Normal breath sounds.  Skin:    Capillary Refill: Capillary refill takes less than 2 seconds.     Comments: Right upper buttocks near crease with firm vesicle present  Neurological:     General: No focal deficit present.     Mental Status: She is alert and oriented to person, place, and time.     Cranial Nerves: No cranial nerve deficit.  Psychiatric:        Mood and Affect: Mood normal.        Behavior: Behavior normal.        Thought Content: Thought content normal.        Judgment: Judgment normal.         Assessment And Plan:     1. Furuncle of anus  Firm vesicle present to upper right buttocks concerning for cellulitis, will treat with cephalexin and she is to soak in warm water daily - cephALEXin (KEFLEX) 500 MG capsule; Take 1 capsule (500 mg total) by mouth 4 (four) times daily for 10 days.  Dispense: 40 capsule; Refill: 0     Patient was given  opportunity to ask questions. Patient verbalized understanding of the plan and was able to repeat key elements of the plan. All questions were answered to their satisfaction.  Arnette Felts, FNP   I, Arnette Felts, FNP, have reviewed all documentation for this visit. The documentation on 05/07/20 for the exam, diagnosis, procedures, and orders are all accurate and complete.   IF YOU HAVE BEEN REFERRED TO A SPECIALIST, IT MAY TAKE 1-2 WEEKS TO SCHEDULE/PROCESS THE REFERRAL. IF YOU HAVE NOT HEARD FROM US/SPECIALIST IN TWO WEEKS, PLEASE GIVE [place] A CALL AT [phone].   THE PATIENT IS ENCOURAGED TO PRACTICE SOCIAL DISTANCING DUE TO THE COVID-19 PANDEMIC.

## 2020-05-07 NOTE — Patient Instructions (Addendum)
1 - 800- Quit-Now to help with smoking cessation Apply warm compress to area 2 times a day or you can soak in the tub.   [phone] - [facility]

## 2020-05-26 DIAGNOSIS — Z419 Encounter for procedure for purposes other than remedying health state, unspecified: Secondary | ICD-10-CM | POA: Diagnosis not present

## 2020-06-01 ENCOUNTER — Other Ambulatory Visit: Payer: Self-pay | Admitting: Nurse Practitioner

## 2020-06-26 DIAGNOSIS — Z419 Encounter for procedure for purposes other than remedying health state, unspecified: Secondary | ICD-10-CM | POA: Diagnosis not present

## 2020-07-17 ENCOUNTER — Other Ambulatory Visit: Payer: Self-pay

## 2020-07-17 ENCOUNTER — Ambulatory Visit
Admission: RE | Admit: 2020-07-17 | Discharge: 2020-07-17 | Disposition: A | Discharge status: Home / Self Care | Payer: Medicaid Other | Source: Ambulatory Visit | Attending: Nurse Practitioner | Admitting: Nurse Practitioner

## 2020-07-17 DIAGNOSIS — Z1231 Encounter for screening mammogram for malignant neoplasm of breast: Secondary | ICD-10-CM

## 2020-07-26 DIAGNOSIS — Z419 Encounter for procedure for purposes other than remedying health state, unspecified: Secondary | ICD-10-CM | POA: Diagnosis not present

## 2020-08-26 DIAGNOSIS — Z419 Encounter for procedure for purposes other than remedying health state, unspecified: Secondary | ICD-10-CM | POA: Diagnosis not present

## 2020-09-26 DIAGNOSIS — Z419 Encounter for procedure for purposes other than remedying health state, unspecified: Secondary | ICD-10-CM | POA: Diagnosis not present

## 2020-10-14 ENCOUNTER — Encounter: Payer: Medicaid Other | Admitting: Family Medicine

## 2020-10-26 DIAGNOSIS — Z419 Encounter for procedure for purposes other than remedying health state, unspecified: Secondary | ICD-10-CM | POA: Diagnosis not present

## 2020-11-26 DIAGNOSIS — Z419 Encounter for procedure for purposes other than remedying health state, unspecified: Secondary | ICD-10-CM | POA: Diagnosis not present

## 2020-12-05 ENCOUNTER — Other Ambulatory Visit: Payer: Self-pay

## 2020-12-05 ENCOUNTER — Ambulatory Visit (INDEPENDENT_AMBULATORY_CARE_PROVIDER_SITE_OTHER): Payer: Medicaid Other | Admitting: Nurse Practitioner

## 2020-12-05 ENCOUNTER — Encounter: Payer: Self-pay | Admitting: Nurse Practitioner

## 2020-12-05 VITALS — BP 136/88 | HR 73 | Temp 98.1°F | Ht 65.0 in | Wt 229.8 lb

## 2020-12-05 DIAGNOSIS — E6609 Other obesity due to excess calories: Secondary | ICD-10-CM

## 2020-12-05 DIAGNOSIS — Z0000 Encounter for general adult medical examination without abnormal findings: Secondary | ICD-10-CM

## 2020-12-05 DIAGNOSIS — Z6838 Body mass index (BMI) 38.0-38.9, adult: Secondary | ICD-10-CM

## 2020-12-05 DIAGNOSIS — Z720 Tobacco use: Secondary | ICD-10-CM

## 2020-12-05 DIAGNOSIS — R030 Elevated blood-pressure reading, without diagnosis of hypertension: Secondary | ICD-10-CM | POA: Diagnosis not present

## 2020-12-05 DIAGNOSIS — Z2821 Immunization not carried out because of patient refusal: Secondary | ICD-10-CM

## 2020-12-05 DIAGNOSIS — Z79899 Other long term (current) drug therapy: Secondary | ICD-10-CM

## 2020-12-05 NOTE — Patient Instructions (Signed)

## 2020-12-05 NOTE — Progress Notes (Signed)
I,Katawbba Wiggins,acting as a Education administrator for [employer], FNP.,have documented all relevant documentation on the behalf of [name], FNP,as directed by  [name], FNP while in the presence of [name], [place].   This visit occurred during the SARS-CoV-2 public health emergency.  Safety protocols were in place, including screening questions prior to the visit, additional usage of staff PPE, and extensive cleaning of exam room while observing appropriate contact time as indicated for disinfecting solutions.  Subjective:     Patient ID: Alicia Perry , female    DOB: Aug 07, 1980 , 40 y.o.   MRN: [id number]   Chief Complaint  Patient presents with   Annual Exam    HPI  Here for HM. Last pap was 12/05/2019, has GYN Jeannetta Nap at [facility], next appt is next week. When she went dentist yesterday her blood pressure was 168/101, she had drank caffeinated coffee and had smoked cigarettes prior.      Past Medical History:  Diagnosis Date   No pertinent past medical history      Family History  Problem Relation Age of Onset   Diabetes Father    Hypertension Father    Heart failure Father    Breast cancer Neg Hx      Current Outpatient Medications:    fluconazole (DIFLUCAN) 150 MG tablet, Take one tablet by mouth, Disp: 1 tablet, Rfl: 0   imiquimod (ALDARA) 5 % cream, Apply topically 3 (three) times a week. Apply at night; wash off after 8 hours. Apply carefully around rectum. Discontinue use once warts are gone., Disp: 12 each, Rfl: 2   PROAIR HFA 108 (90 Base) MCG/ACT inhaler, INHALE 2 PUFFS INTO THE LUNGS EVERY 6 HOURS AS NEEDED FOR WHEEZING OR SHORTNESS OF BREATH, Disp: 8.5 g, Rfl: 2   No Known Allergies    The patient states she uses none for birth control. Last LMP was Patient's last menstrual period was 11/26/2020.. Negative for Dysmenorrhea and Negative for Menorrhagia. Negative for: breast discharge, breast lump(s), breast pain and breast self exam. Associated  symptoms include abnormal vaginal bleeding. Pertinent negatives include abnormal bleeding (hematology), anxiety, decreased libido, depression, difficulty falling sleep, dyspareunia, history of infertility, nocturia, sexual dysfunction, sleep disturbances, urinary incontinence, urinary urgency, vaginal discharge and vaginal itching. Diet regular; she does admit to drinking too many sodas The patient states her exercise level is minimal with 2 days a week with 15 minutes each, she has an exercise bike so she will get on that.   The patient's tobacco use is:  Social History   Tobacco Use  Smoking Status Every Day   Packs/day: 0.75   Years: 20.00   Pack years: 15.00   Types: Cigarettes  Smokeless Tobacco Never  Tobacco Comments   She quit while she was pregnant    She has been exposed to passive smoke. The patient's alcohol use is:  Social History   Substance and Sexual Activity  Alcohol Use Yes   Comment: socially   Additional information: Last pap 12/05/2019, next one scheduled for 12/04/2020.    Review of Systems  Constitutional: Negative.   HENT: Negative.    Eyes: Negative.   Respiratory: Negative.    Cardiovascular: Negative.   Gastrointestinal: Negative.   Endocrine: Negative.   Genitourinary: Negative.   Musculoskeletal: Negative.   Skin: Negative.   Allergic/Immunologic: Negative.   Neurological: Negative.   Hematological: Negative.   Psychiatric/Behavioral: Negative.      Today's Vitals   12/05/20 0914  BP: 136/88  Pulse: 73  Temp: 98.1 F (36.7 C)  Weight: 229 lb 12.8 oz (104.2 kg)  Height: 5' 5"  (1.651 m)   Body mass index is 38.24 kg/m.  Wt Readings from Last 3 Encounters:  12/05/20 229 lb 12.8 oz (104.2 kg)  05/07/20 225 lb 9.6 oz (102.3 kg)  02/15/20 222 lb 12.8 oz (101.1 kg)    BP Readings from Last 3 Encounters:  12/05/20 136/88  05/07/20 124/80  02/15/20 (!) 153/84    Objective:  Physical Exam Constitutional:      General: She is not in  acute distress.    Appearance: Normal appearance. She is well-developed. She is obese.  HENT:     Head: Normocephalic and atraumatic.     Right Ear: Hearing, tympanic membrane, ear canal and external ear normal. There is no impacted cerumen.     Left Ear: Hearing, tympanic membrane, ear canal and external ear normal. There is no impacted cerumen.     Nose:     Comments: Deferred - masked    Mouth/Throat:     Comments: Deferred - masked Eyes:     General: Lids are normal.     Extraocular Movements: Extraocular movements intact.     Conjunctiva/sclera: Conjunctivae normal.     Pupils: Pupils are equal, round, and reactive to light.     Funduscopic exam:    Right eye: No papilledema.        Left eye: No papilledema.  Neck:     Thyroid: No thyroid mass.     Vascular: No carotid bruit.  Cardiovascular:     Rate and Rhythm: Normal rate and regular rhythm.     Pulses: Normal pulses.     Heart sounds: Normal heart sounds. No murmur heard. Pulmonary:     Effort: Pulmonary effort is normal.     Breath sounds: Normal breath sounds.  Chest:     Chest wall: No mass.  Breasts:    Tanner Score is 5.     Right: Normal. No mass or tenderness.     Left: Normal. No mass or tenderness.  Abdominal:     General: Abdomen is flat. Bowel sounds are normal. There is no distension.     Palpations: Abdomen is soft.     Tenderness: There is no abdominal tenderness.  Musculoskeletal:        General: No swelling. Normal range of motion.     Cervical back: Full passive range of motion without pain, normal range of motion and neck supple.     Right lower leg: No edema.     Left lower leg: No edema.  Lymphadenopathy:     Upper Body:     Right upper body: No supraclavicular, axillary or pectoral adenopathy.     Left upper body: No supraclavicular, axillary or pectoral adenopathy.  Skin:    General: Skin is warm and dry.     Capillary Refill: Capillary refill takes less than 2 seconds.  Neurological:      General: No focal deficit present.     Mental Status: She is alert and oriented to person, place, and time.     Cranial Nerves: No cranial nerve deficit.     Sensory: No sensory deficit.  Psychiatric:        Mood and Affect: Mood normal.        Behavior: Behavior normal.        Thought Content: Thought content normal.        Judgment: Judgment normal.  Assessment And Plan:     1. Encounter for general adult medical examination w/o abnormal findings Behavior modifications discussed and diet history reviewed.   Pt will continue to exercise regularly and modify diet with low GI, plant based foods and decrease intake of processed foods.  Recommend intake of daily multivitamin, Vitamin D, and calcium.  Recommend mammogram for preventive screenings, as well as recommend immunizations that include influenza, TDAP, she declined influenza vaccine - CMP14+EGFR - Lipid panel  2. Pneumococcal vaccination declined  3. Influenza vaccination declined Patient declined influenza vaccination at this time. Patient is aware that influenza vaccine prevents illness in 70% of healthy people, and reduces hospitalizations to 30-70% in elderly. This vaccine is recommended annually. Pt is willing to accept risk associated with refusing vaccination.  4. Class 2 obesity due to excess calories without serious comorbidity with body mass index (BMI) of 38.0 to 38.9 in adult She is encouraged to strive for BMI less than 30 to decrease cardiac risk. Advised to aim for at least 150 minutes of exercise per week.  - Insulin, random  5. Tobacco abuse She is not interested in starting nicotine patches Smoking cessation instruction/counseling given:  counseled patient on the dangers of tobacco use, advised patient to stop smoking, and reviewed strategies to maximize success   6. Other long term (current) drug therapy - CBC  7. Elevated blood pressure reading without diagnosis of hypertension Comments:  Blood pressure today is better, I have encouraged her to limit her salt intake and focus on healthy diet. Encouraged to exercise more regularly at least 150 minutes a week.     Patient was given opportunity to ask questions. Patient verbalized understanding of the plan and was able to repeat key elements of the plan. All questions were answered to their satisfaction.   [name], FNP   I, [name], FNP, have reviewed all documentation for this visit. The documentation on 12/05/20 for the exam, diagnosis, procedures, and orders are all accurate and complete.   THE PATIENT IS ENCOURAGED TO PRACTICE SOCIAL DISTANCING DUE TO THE COVID-19 PANDEMIC.

## 2020-12-06 LAB — CMP14+EGFR
ALT: 14 IU/L (ref 0–32)
AST: 11 IU/L (ref 0–40)
Albumin/Globulin Ratio: 1.4 (ref 1.2–2.2)
Albumin: 4.1 g/dL (ref 3.8–4.8)
Alkaline Phosphatase: 101 IU/L (ref 44–121)
BUN/Creatinine Ratio: 10 (ref 9–23)
BUN: 8 mg/dL (ref 6–20)
Bilirubin Total: 0.2 mg/dL (ref 0.0–1.2)
CO2: 23 mmol/L (ref 20–29)
Calcium: 9.6 mg/dL (ref 8.7–10.2)
Chloride: 103 mmol/L (ref 96–106)
Creatinine, Ser: 0.83 mg/dL (ref 0.57–1.00)
Globulin, Total: 3 g/dL (ref 1.5–4.5)
Glucose: 101 mg/dL — ABNORMAL HIGH (ref 70–99)
Potassium: 4.6 mmol/L (ref 3.5–5.2)
Sodium: 138 mmol/L (ref 134–144)
Total Protein: 7.1 g/dL (ref 6.0–8.5)
eGFR: 92 mL/min/{1.73_m2} (ref >59)

## 2020-12-06 LAB — CBC
Hematocrit: 40.8 % (ref 34.0–46.6)
Hemoglobin: 14 g/dL (ref 11.1–15.9)
MCH: 32.6 pg (ref 26.6–33.0)
MCHC: 34.3 g/dL (ref 31.5–35.7)
MCV: 95 fL (ref 79–97)
Platelets: 411 10*3/uL (ref 150–450)
RBC: 4.29 x10E6/uL (ref 3.77–5.28)
RDW: 11.1 % — ABNORMAL LOW (ref 11.7–15.4)
WBC: 6.3 10*3/uL (ref 3.4–10.8)

## 2020-12-06 LAB — LIPID PANEL
Chol/HDL Ratio: 3.5 ratio (ref 0.0–4.4)
Cholesterol, Total: 152 mg/dL (ref 100–199)
HDL: 44 mg/dL (ref >39)
LDL Chol Calc (NIH): 92 mg/dL (ref 0–99)
Triglycerides: 86 mg/dL (ref 0–149)
VLDL Cholesterol Cal: 16 mg/dL (ref 5–40)

## 2020-12-06 LAB — INSULIN, RANDOM: INSULIN: 16.7 u[IU]/mL (ref 2.6–24.9)

## 2020-12-10 ENCOUNTER — Ambulatory Visit (INDEPENDENT_AMBULATORY_CARE_PROVIDER_SITE_OTHER): Payer: Medicaid Other | Admitting: Family Medicine

## 2020-12-10 ENCOUNTER — Encounter: Payer: Self-pay | Admitting: Family Medicine

## 2020-12-10 ENCOUNTER — Other Ambulatory Visit: Payer: Self-pay

## 2020-12-10 ENCOUNTER — Encounter: Payer: Self-pay | Admitting: General Practice

## 2020-12-10 DIAGNOSIS — A630 Anogenital (venereal) warts: Secondary | ICD-10-CM

## 2020-12-10 NOTE — Assessment & Plan Note (Signed)
S/p TCA today--

## 2020-12-10 NOTE — Progress Notes (Signed)
   Subjective:    Patient ID: Alicia Perry is a 40 y.o. female presenting with Genital Warts  on 12/10/2020  HPI: Here for f/u. Has genital warts. Used Aldara with limited results.   Review of Systems  Constitutional:  Negative for chills and fever.  Respiratory:  Negative for shortness of breath.   Cardiovascular:  Negative for chest pain.  Gastrointestinal:  Negative for abdominal pain, nausea and vomiting.  Genitourinary:  Negative for dysuria.  Skin:  Negative for rash.     Objective:    BP 140/85   Pulse 74   Ht 5\' 3"  (1.6 m)   Wt 227 lb 3.2 oz (103.1 kg)   LMP 11/26/2020   BMI 40.25 kg/m  Physical Exam Constitutional:      General: She is not in acute distress.    Appearance: She is well-developed.  HENT:     Head: Normocephalic and atraumatic.  Eyes:     General: No scleral icterus. Cardiovascular:     Rate and Rhythm: Normal rate.  Pulmonary:     Effort: Pulmonary effort is normal.  Abdominal:     Palpations: Abdomen is soft.  Genitourinary:   Musculoskeletal:     Cervical back: Neck supple.  Skin:    General: Skin is warm and dry.  Neurological:     Mental Status: She is alert and oriented to person, place, and time.   Procedure: Patient identified, informed consent signed and copy in chart, time out performed.    Areas of typical appearing genital warts noted.  Surrounding area coated with water based lubricant and TCA applied until warts had white appearance.   Slurry of baking soda applied  Patient tolerated the procedure well.    Post procedure instructions given and patient told to wash area in thirty minutes.     Assessment & Plan:   Problem List Items Addressed This Visit       Unprioritized   Genital warts    S/p TCA today--       Return in about 2 weeks (around 12/24/2020) for TCA treatment.  12/26/2020 12/10/2020 9:36 AM

## 2020-12-24 ENCOUNTER — Ambulatory Visit (INDEPENDENT_AMBULATORY_CARE_PROVIDER_SITE_OTHER): Payer: Medicaid Other | Admitting: Obstetrics and Gynecology

## 2020-12-24 ENCOUNTER — Other Ambulatory Visit: Payer: Self-pay

## 2020-12-24 ENCOUNTER — Encounter: Payer: Self-pay | Admitting: Obstetrics and Gynecology

## 2020-12-24 VITALS — BP 140/84 | HR 78 | Wt 227.1 lb

## 2020-12-24 DIAGNOSIS — A630 Anogenital (venereal) warts: Secondary | ICD-10-CM | POA: Diagnosis not present

## 2020-12-24 NOTE — Progress Notes (Signed)
    GYNECOLOGY ENCOUNTER NOTE  History:     Alicia Perry is a 40 y.o. G71P1011 female here for a second TCA treatment for a history of genital warts.   Obstetric History OB History  Gravida Para Term Preterm AB Living  2 1 1  0 1 1  SAB IAB Ectopic Multiple Live Births  0 0 0 0 1    # Outcome Date GA Lbr Len/2nd Weight Sex Delivery Anes PTL Lv  2 Term 08/04/11 [redacted]w[redacted]d 15:16 / 01:02 6 lb 11.2 oz (3.04 kg) F Vag-Vacuum EPI  LIV  1 AB 08/03/00            Past Medical History:  Diagnosis Date   No pertinent past medical history     Past Surgical History:  Procedure Laterality Date   NO PAST SURGERIES      Current Outpatient Medications on File Prior to Visit  Medication Sig Dispense Refill   Multiple Vitamin (MULTIVITAMIN WITH MINERALS) TABS tablet Take 1 tablet by mouth daily. (Patient not taking: Reported on 12/24/2020)     PROAIR HFA 108 (90 Base) MCG/ACT inhaler INHALE 2 PUFFS INTO THE LUNGS EVERY 6 HOURS AS NEEDED FOR WHEEZING OR SHORTNESS OF BREATH (Patient not taking: Reported on 12/24/2020) 8.5 g 2   No current facility-administered medications on file prior to visit.    No Known Allergies  Social History:  reports that she has been smoking cigarettes. She has a 15.00 pack-year smoking history. She has never used smokeless tobacco. She reports current alcohol use. She reports that she does not use drugs.  Family History  Problem Relation Age of Onset   Diabetes Father    Hypertension Father    Heart failure Father    Breast cancer Neg Hx     The following portions of the patient's history were reviewed and updated as appropriate: allergies, current medications, past family history, past medical history, past social history, past surgical history and problem list.  Review of Systems Pertinent items noted in HPI and remainder of comprehensive ROS otherwise negative.  Physical Exam:  BP 140/84   Pulse 78   Wt 227 lb 1.6 oz (103 kg)   LMP 11/26/2020   BMI  40.23 kg/m  CONSTITUTIONAL: Well-developed, well-nourished female in no acute distress.  HENT:  Normocephalic, atraumatic, External right and left ear normal.  EYES: Conjunctivae and EOM are normal.  Subjective:          Procedure: Patient identified, informed consent signed and copy in chart, time out performed.     Areas of typical appearing genital warts noted.  Surrounding area coated with water based lubricant and TCA applied until warts had white appearance.    Slurry of baking soda applied   Patient tolerated the procedure well.     Post procedure instructions given and patient told to wash area in thirty minutes.  Assessment and Plan:    1. Genital warts  TCA applied  Return in 2 weeks as requested by patient.    Jessiah Wojnar, 13/01/2020, NP [facility] for Alicia Perry, [facility]

## 2020-12-26 DIAGNOSIS — Z419 Encounter for procedure for purposes other than remedying health state, unspecified: Secondary | ICD-10-CM | POA: Diagnosis not present

## 2021-01-07 ENCOUNTER — Ambulatory Visit (INDEPENDENT_AMBULATORY_CARE_PROVIDER_SITE_OTHER): Payer: Medicaid Other | Admitting: Student

## 2021-01-07 ENCOUNTER — Other Ambulatory Visit: Payer: Self-pay

## 2021-01-07 VITALS — BP 131/61 | HR 79 | Wt 227.5 lb

## 2021-01-07 DIAGNOSIS — A630 Anogenital (venereal) warts: Secondary | ICD-10-CM | POA: Diagnosis not present

## 2021-01-08 NOTE — Progress Notes (Signed)
°  History:  [name] is a 40 y.o. G2P1011 who presents to clinic today for treatment for genital warts. This is her third treatment. She reports slight discomfort after each treatment. She also reports that warts do not seem to be decreasing in size. She expressed frustration over the fact that warts are still present. She has also tried Aldara earlier this year  but with no improvement.   The following portions of the patient's history were reviewed and updated as appropriate: allergies, current medications, family history, past medical history, social history, past surgical history and problem list.  Review of Systems:  Review of Systems  All other systems reviewed and are negative.    Objective:  Physical Exam BP 131/61    Pulse 79    Wt 227 lb 8 oz (103.2 kg)    LMP 12/31/2020    BMI 40.30 kg/m  Physical Exam Constitutional:      Appearance: Normal appearance.  Cardiovascular:     Rate and Rhythm: Normal rate.  Pulmonary:     Effort: Pulmonary effort is normal.  Genitourinary:   Musculoskeletal:     Cervical back: Normal range of motion.  Neurological:     General: No focal deficit present.     Mental Status: She is alert.      Labs and Imaging No results found for this or any previous visit (from the past 24 hour(s)).  No results found.  Health Maintenance Due  Topic Date Due   COVID-19 Vaccine (3 - Pfizer risk series) 10/04/2019    Patient identified, informed consent signed and copy in chart, time out performed.     Areas of typical appearing genital warts noted.  Surrounding area coated with water based lubricant and TCA applied until warts had white appearance.    Slurry of baking soda applied   Patient tolerated the procedure well.     Post procedure instructions given and patient told to wash area in thirty minutes.   Assessment & Plan:    1. Genital warts    -patient tolerated treatment well -will make appt for two weeks.  -due to  patient report that warts not decreasing in size, will consult with MD to see if another therapy may be better and will send patient My Chart message.   Approximately 25 minutes of total time was spent with this patient on care and treatment.  Marylene Land, CNM 01/08/2021 11:16 AM

## 2021-01-09 ENCOUNTER — Encounter: Payer: Self-pay | Admitting: Student

## 2021-01-12 ENCOUNTER — Ambulatory Visit (HOSPITAL_COMMUNITY): Payer: Medicaid Other

## 2021-01-26 DIAGNOSIS — Z419 Encounter for procedure for purposes other than remedying health state, unspecified: Secondary | ICD-10-CM | POA: Diagnosis not present

## 2021-01-28 ENCOUNTER — Ambulatory Visit: Payer: Medicaid Other | Admitting: Student

## 2021-02-04 ENCOUNTER — Other Ambulatory Visit: Payer: Self-pay

## 2021-02-04 ENCOUNTER — Ambulatory Visit (INDEPENDENT_AMBULATORY_CARE_PROVIDER_SITE_OTHER): Payer: Medicaid Other | Admitting: Family Medicine

## 2021-02-04 ENCOUNTER — Encounter: Payer: Self-pay | Admitting: Family Medicine

## 2021-02-04 ENCOUNTER — Other Ambulatory Visit: Payer: Self-pay | Admitting: Nurse Practitioner

## 2021-02-04 VITALS — BP 143/73 | HR 85 | Wt 228.5 lb

## 2021-02-04 DIAGNOSIS — A630 Anogenital (venereal) warts: Secondary | ICD-10-CM

## 2021-02-04 NOTE — Progress Notes (Signed)
° °  GYNECOLOGY OFFICE VISIT NOTE  History:  41 y.o. G2P1011 here today for follow up for genital warts and TCA treatment. This is patient's 4th treatment.  Thinks its getting better and shrinking. Denies pain and itching. Reports has some discomfort if a lesion rubs against pad when she is on her period.    Review of Systems:  Pertinent items noted in HPI Review of Systems  All other systems reviewed and are negative.  Objective:  Physical Exam BP (!) 143/73    Pulse 85    Wt 228 lb 8 oz (103.6 kg)    LMP 01/26/2021 (Exact Date)    BMI 40.48 kg/m  Physical Exam Vitals and nursing note reviewed. Exam conducted with a chaperone present.  Cardiovascular:     Rate and Rhythm: Normal rate.     Pulses: Normal pulses.  Pulmonary:     Effort: Pulmonary effort is normal.  Genitourinary:   Neurological:     Mental Status: She is alert.     Assessment & Plan:  Genital warts S/p treatment x3. Here for  4th treatment today.  Feels like the treatment is working and shrinking her lesions. Treated with TCA today in two areas as depicted above.  Discussed washing off in 30 min to 1 hr  Return in about 3 weeks (around 02/25/2021) for TCA treatment/follow up.   Total face-to-face time with patient: 25 minutes.  Over 50% of encounter was spent on counseling and coordination of care.  Warner Mccreedy, MD, MPH OB Fellow, Faculty [facility] for [facility], [facility]

## 2021-02-05 ENCOUNTER — Other Ambulatory Visit: Payer: Self-pay | Admitting: Nurse Practitioner

## 2021-02-05 MED ORDER — ALBUTEROL SULFATE HFA 108 (90 BASE) MCG/ACT IN AERS: 2.0000 | INHALATION_SPRAY | Freq: Four times a day (QID) | RESPIRATORY_TRACT | 1 refills | Status: DC | PRN

## 2021-02-26 DIAGNOSIS — Z419 Encounter for procedure for purposes other than remedying health state, unspecified: Secondary | ICD-10-CM | POA: Diagnosis not present

## 2021-03-04 ENCOUNTER — Ambulatory Visit: Payer: Medicaid Other | Admitting: Nurse Practitioner

## 2021-03-11 ENCOUNTER — Encounter: Payer: Self-pay | Admitting: Nurse Practitioner

## 2021-03-11 ENCOUNTER — Other Ambulatory Visit: Payer: Self-pay

## 2021-03-11 ENCOUNTER — Ambulatory Visit (INDEPENDENT_AMBULATORY_CARE_PROVIDER_SITE_OTHER): Payer: Medicaid Other | Admitting: Nurse Practitioner

## 2021-03-11 VITALS — BP 138/89 | HR 91 | Wt 224.8 lb

## 2021-03-11 DIAGNOSIS — A630 Anogenital (venereal) warts: Secondary | ICD-10-CM | POA: Diagnosis not present

## 2021-03-11 MED ORDER — IMIQUIMOD 5 % EX CREA: TOPICAL_CREAM | CUTANEOUS | 0 refills | Status: DC

## 2021-03-11 NOTE — Progress Notes (Signed)
Alicia Perry 41 y.o.  CC: TCA treatment S: has had several treatments the office  - near the clitoris and beside the rectum O:  exam shows all skin intact.  No visible raised areas near the rectum where she has had previous treatments.  Unable to distinguish possible remaining genital warts vs. Slighty raised hair follicles. Raised HPV area 66mmx3mm seen between 11 and 12 o'clock above the clitoris.  Normal skin protected with water soluble gel and TCA applied with wooden end of swab to the area.  White coloration noted on treated area. P: Return in 3 weeks for any additional TCA treatment Prescribed Aldara cream to use near rectum to treat any small residual warts - too small for TCA treatment.  Earlie Server, NP 03-11-21  12:30 pm

## 2021-03-12 ENCOUNTER — Encounter: Payer: Self-pay | Admitting: Nurse Practitioner

## 2021-03-26 DIAGNOSIS — Z419 Encounter for procedure for purposes other than remedying health state, unspecified: Secondary | ICD-10-CM | POA: Diagnosis not present

## 2021-03-31 ENCOUNTER — Other Ambulatory Visit: Payer: Self-pay

## 2021-03-31 ENCOUNTER — Ambulatory Visit (INDEPENDENT_AMBULATORY_CARE_PROVIDER_SITE_OTHER): Payer: Medicaid Other | Admitting: Nurse Practitioner

## 2021-03-31 ENCOUNTER — Encounter: Payer: Self-pay | Admitting: Nurse Practitioner

## 2021-03-31 VITALS — BP 128/74 | HR 83 | Temp 98.3°F | Ht 63.0 in | Wt 223.6 lb

## 2021-03-31 DIAGNOSIS — E6609 Other obesity due to excess calories: Secondary | ICD-10-CM

## 2021-03-31 DIAGNOSIS — R030 Elevated blood-pressure reading, without diagnosis of hypertension: Secondary | ICD-10-CM

## 2021-03-31 DIAGNOSIS — Z720 Tobacco use: Secondary | ICD-10-CM | POA: Diagnosis not present

## 2021-03-31 DIAGNOSIS — Z6839 Body mass index (BMI) 39.0-39.9, adult: Secondary | ICD-10-CM | POA: Diagnosis not present

## 2021-03-31 NOTE — Patient Instructions (Addendum)
Preventing Hypertension ?Hypertension, also called high blood pressure, is when the force of blood pumping through the arteries is too strong. Arteries are blood vessels that carry blood from the heart throughout the body. Often, hypertension does not cause symptoms until blood pressure is very high. It is important to have your blood pressure checked regularly. ?Diet and lifestyle changes can help you prevent hypertension, and they may make you feel better overall and improve your quality of life. If you already have hypertension, you may control it with diet and lifestyle changes, as well as with medicine. ?How can this condition affect me? ?Over time, hypertension can damage the arteries and decrease blood flow to important parts of the body, including the brain, heart, and kidneys. By keeping your blood pressure in a healthy range, you can help prevent complications like heart attack, heart failure, stroke, kidney failure, and vascular dementia. ?What can increase my risk? ?Being an older adult. Older people are more often affected. ?Having family members who have had high blood pressure. ?Being obese. ?Being female. Males are more likely to have high blood pressure. ?Drinking too much alcohol or caffeine. ?Smoking or using illegal drugs. ?Taking certain medicines, such as antidepressants, decongestants, birth control pills, and NSAIDs, such as ibuprofen. ?Having thyroid problems. ?Having certain tumors. ?What actions can I take to prevent or manage this condition? ?Work with your health care provider to make a hypertension prevention plan that works for you. Follow your plan and keep all follow-up visits as told by your health care provider. ?Diet changes ?Maintain a healthy diet. This includes: ?Eating less salt (sodium). Ask your health care provider how much sodium is safe for you to have. The general recommendation is to have less than 1 tsp (2,300 mg) of sodium a day. ?Do not add salt to your food. ?Choose  low-sodium options when grocery shopping and eating out. ?Limiting fats in your diet. You can do this by eating low-fat or fat-free dairy products and by eating less red meat. ?Eating more fruits, vegetables, and whole grains. Make a goal to eat: ?1?-2 cups of fresh fruits and vegetables each day. ?3-4 servings of whole grains each day. ?Avoiding foods and beverages that have added sugars. ?Eating fish that contain healthy fats (omega-3 fatty acids), such as mackerel or salmon. ?If you need help putting together a healthy eating plan, try the DASH diet. This diet is high in fruits, vegetables, and whole grains. It is low in sodium, red meat, and added sugars. DASH stands for Dietary Approaches to Stop Hypertension. ?Lifestyle changes ?Lose weight if you are overweight. Losing just 3?5% of your body weight can help prevent or control hypertension. For example, if your present weight is 200 lb (91 kg), a loss of 3-5% of your weight means losing 6-10 lb (2.7-4.5 kg). Ask your health care provider to help you with a diet and exercise plan to safely lose weight. ?Other recommendations usually include: ?Get enough exercise. Do at least 150 minutes of moderate-intensity exercise each week. You could do this in short exercise sessions several times a day, or you could do longer exercise sessions a few times a week. For example, you could take a brisk 10-minute walk or bike ride, 3 times a day, for 5 days a week. ?Find ways to reduce stress, such as exercising, meditating, listening to music, or taking a yoga class. If you need help reducing stress, ask your health care provider. ?Do not use any products that contain nicotine or tobacco, such  as cigarettes, e-cigarettes, and chewing tobacco. If you need help quitting, ask your health care provider. Chemicals in tobacco and nicotine products raise your blood pressure each time you use them. If you need help quitting, ask your health care provider. ?Learn how to check your  blood pressure at home. Make sure that you know your personal target blood pressure, as told by your health care provider. ?Try to sleep 7-9 hours per night. ? ?Alcohol use ?Do not drink alcohol if: ?Your health care provider tells you not to drink. ?You are pregnant, may be pregnant, or are planning to become pregnant. ?If you drink alcohol: ?Limit how much you use to: ?0-1 drink a day for women. ?0-2 drinks a day for men. ?Be aware of how much alcohol is in your drink. In the [place] one drink equals one 12 oz bottle of beer (355 mL), one 5 oz glass of wine (148 mL), or one 1? oz glass of hard liquor (44 mL). ?Medicines ?In addition to diet and lifestyle changes, your health care provider may recommend medicines to help lower your blood pressure. In general: ?You may need to try a few different medicines to find what works best for you. ?You may need to take more than one medicine. ?Take over-the-counter and prescription medicines only as told by your health care provider. ?Questions to ask your health care provider ?What is my blood pressure goal? ?How can I lower my risk for high blood pressure? ?How should I monitor my blood pressure at home? ?Where to find support ?Your health care provider can help you prevent hypertension and help you keep your blood pressure at a healthy level. Your local hospital or your community may also provide support services and prevention programs. ?The American Heart Association offers an online support network at supportnetwork.heart.org ?Where to find more information ?Learn more about hypertension from: ?National Heart, Lung, and Blood Institute: https://wilson-eaton.com/ ?Centers for Disease Control and Prevention: http://www.wolf.info/ ?American Academy of Family Physicians: familydoctor.org ?Learn more about the DASH diet from: ?National Heart, Lung, and Blood Institute: https://wilson-eaton.com/ ?Contact a health care provider if: ?You think you are having a reaction to medicines you have taken. ?You  have recurrent headaches or feel dizzy. ?You have swelling in your ankles. ?You have trouble with your vision. ?Get help right away if: ?You have sudden, severe chest, back, or abdominal pain or discomfort. ?You have shortness of breath. ?You have a sudden, severe headache. ?These symptoms may represent a serious problem that is an emergency. Do not wait to see if the symptoms will go away. Get medical help right away. Call your local emergency services (911 in the [place] Do not drive yourself to the hospital.  ?Summary ?Hypertension often does not cause any symptoms until blood pressure is very high. It is important to get your blood pressure checked regularly. ?Diet and lifestyle changes are important steps in preventing hypertension. ?By keeping your blood pressure in a healthy range, you may prevent complications like heart attack, heart failure, stroke, and kidney failure. ?Work with your health care provider to make a hypertension prevention plan that works for you. ?This information is not intended to replace advice given to you by your health care provider. Make sure you discuss any questions you have with your health care provider. ?Document Revised: 12/13/2018 Document Reviewed: 12/13/2018 ?Elsevier Patient Education ? [place]. ? ?Take magnesium 250 mg nightly to help with blood pressure. ?

## 2021-03-31 NOTE — Progress Notes (Signed)
?Clinical biochemist as a Neurosurgeon for [employer], FNP.,have documented all relevant documentation on the behalf of Arnette Felts, FNP,as directed by  Arnette Felts, FNP while in the presence of Arnette Felts, FNP. ? ?This visit occurred during the SARS-CoV-2 public health emergency.  Safety protocols were in place, including screening questions prior to the visit, additional usage of staff PPE, and extensive cleaning of exam room while observing appropriate contact time as indicated for disinfecting solutions. ? ?Subjective:  ?  ? Patient ID: Alicia Perry , female    DOB: 1980-08-08 , 41 y.o.   MRN: [id number] ? ? ?Chief Complaint  ?Patient presents with  ? Hypertension  ? ? ?HPI ? ?Pt presents for dental clearance she had an elevated blood pressure in October 2022. She is not taking magnesium. She has cut back on her sugar intake and cut back on her salt intake. She is not exercising she is to start walking today. She is to have extensive dental cleaning ? ?Wt Readings from Last 3 Encounters: ?03/31/21 : 223 lb 9.6 oz (101.4 kg) ?03/11/21 : 224 lb 12.8 oz (102 kg) ?02/04/21 : 228 lb 8 oz (103.6 kg) ? ?BP Readings from Last 3 Encounters: ?03/31/21 : 128/74 ?03/11/21 : 138/89 ?02/04/21 : (!) 143/73 ? ? ?  ? ?Past Medical History:  ?Diagnosis Date  ? No pertinent past medical history   ?  ? ?Family History  ?Problem Relation Age of Onset  ? Diabetes Father   ? Hypertension Father   ? Heart failure Father   ? Breast cancer Neg Hx   ? ? ? ?Current Outpatient Medications:  ?  albuterol (VENTOLIN HFA) 108 (90 Base) MCG/ACT inhaler, Inhale 2 puffs into the lungs every 6 (six) hours as needed for wheezing or shortness of breath., Disp: 18 g, Rfl: 1 ?  imiquimod (ALDARA) 5 % cream, Apply topically 3 (three) times a week., Disp: 12 each, Rfl: 0 ?  Multiple Vitamin (MULTIVITAMIN WITH MINERALS) TABS tablet, Take 1 tablet by mouth daily., Disp: , Rfl:   ? ?No Known Allergies  ? ?Review of Systems  ?Constitutional: Negative.    ?Respiratory: Negative.    ?Cardiovascular: Negative.  Negative for chest pain, palpitations and leg swelling.  ?Neurological: Negative.   ?Psychiatric/Behavioral: Negative.     ? ?Today's Vitals  ? 03/31/21 1412  ?BP: 128/74  ?Pulse: 83  ?Temp: 98.3 ?F (36.8 ?C)  ?TempSrc: Oral  ?Weight: 223 lb 9.6 oz (101.4 kg)  ?Height: 5\' 3"  (1.6 m)  ? ?Body mass index is 39.61 kg/m?.  ?Wt Readings from Last 3 Encounters:  ?03/31/21 223 lb 9.6 oz (101.4 kg)  ?03/11/21 224 lb 12.8 oz (102 kg)  ?02/04/21 228 lb 8 oz (103.6 kg)  ? ? ?Objective:  ?Physical Exam ?Vitals reviewed.  ?Constitutional:   ?   General: She is not in acute distress. ?   Appearance: Normal appearance. She is obese. She is not ill-appearing.  ?Cardiovascular:  ?   Rate and Rhythm: Normal rate and regular rhythm.  ?   Pulses: Normal pulses.  ?   Heart sounds: Normal heart sounds. No murmur heard. ?Pulmonary:  ?   Effort: Pulmonary effort is normal. No respiratory distress.  ?   Breath sounds: Normal breath sounds.  ?Skin: ?   Capillary Refill: Capillary refill takes less than 2 seconds.  ?   Comments: Right upper buttocks near crease with firm vesicle present  ?Neurological:  ?   General: No focal deficit present.  ?  Mental Status: She is alert and oriented to person, place, and time.  ?   Cranial Nerves: No cranial nerve deficit.  ?Psychiatric:     ?   Mood and Affect: Mood normal.     ?   Behavior: Behavior normal.     ?   Thought Content: Thought content normal.     ?   Judgment: Judgment normal.  ?  ? ?   ?Assessment And Plan:  ?   ?1. Elevated blood pressure reading without diagnosis of hypertension ?Comments: Blood pressure is controlled today, she is cleared for dental work. Encouraged to take magnesium 250 mg with evening meal and continue to cut back on salt. Encouraged to exercise at least 150 minutes weekly ? ?2. Class 2 obesity due to excess calories without serious comorbidity with body mass index (BMI) of 39.0 to 39.9 in adult ?She is  encouraged to strive for BMI less than 30 to decrease cardiac risk. Advised to aim for at least 150 minutes of exercise per week. ? ?3. Tobacco abuse ?She has cut back to 12 cigarettes daily, would like to continue to cut back naturally. ? ? ?Patient was given opportunity to ask questions. Patient verbalized understanding of the plan and was able to repeat key elements of the plan. All questions were answered to their satisfaction.  ?Arnette Felts, FNP  ? ?I, Arnette Felts, FNP, have reviewed all documentation for this visit. The documentation on 03/31/21 for the exam, diagnosis, procedures, and orders are all accurate and complete.  ? ?IF YOU HAVE BEEN REFERRED TO A SPECIALIST, IT MAY TAKE 1-2 WEEKS TO SCHEDULE/PROCESS THE REFERRAL. IF YOU HAVE NOT HEARD FROM US/SPECIALIST IN TWO WEEKS, PLEASE GIVE [place] A CALL AT [phone].  ? ?THE PATIENT IS ENCOURAGED TO PRACTICE SOCIAL DISTANCING DUE TO THE COVID-19 PANDEMIC.   ?

## 2021-04-01 ENCOUNTER — Ambulatory Visit: Payer: Medicaid Other | Admitting: Advanced Practice Midwife

## 2021-04-02 ENCOUNTER — Ambulatory Visit: Payer: Medicaid Other | Admitting: Family Medicine

## 2021-04-26 DIAGNOSIS — Z419 Encounter for procedure for purposes other than remedying health state, unspecified: Secondary | ICD-10-CM | POA: Diagnosis not present

## 2021-05-26 DIAGNOSIS — Z419 Encounter for procedure for purposes other than remedying health state, unspecified: Secondary | ICD-10-CM | POA: Diagnosis not present

## 2021-06-26 DIAGNOSIS — Z419 Encounter for procedure for purposes other than remedying health state, unspecified: Secondary | ICD-10-CM | POA: Diagnosis not present

## 2021-07-26 DIAGNOSIS — Z419 Encounter for procedure for purposes other than remedying health state, unspecified: Secondary | ICD-10-CM | POA: Diagnosis not present

## 2021-08-26 DIAGNOSIS — Z419 Encounter for procedure for purposes other than remedying health state, unspecified: Secondary | ICD-10-CM | POA: Diagnosis not present

## 2021-09-02 ENCOUNTER — Other Ambulatory Visit: Payer: Self-pay | Admitting: Nurse Practitioner

## 2021-09-02 DIAGNOSIS — Z1231 Encounter for screening mammogram for malignant neoplasm of breast: Secondary | ICD-10-CM

## 2021-09-12 ENCOUNTER — Ambulatory Visit
Admission: RE | Admit: 2021-09-12 | Discharge: 2021-09-12 | Disposition: A | Discharge status: Home / Self Care | Payer: Medicaid Other | Source: Ambulatory Visit | Attending: Nurse Practitioner | Admitting: Nurse Practitioner

## 2021-09-12 DIAGNOSIS — Z1231 Encounter for screening mammogram for malignant neoplasm of breast: Secondary | ICD-10-CM | POA: Diagnosis not present

## 2021-09-26 DIAGNOSIS — Z419 Encounter for procedure for purposes other than remedying health state, unspecified: Secondary | ICD-10-CM | POA: Diagnosis not present

## 2021-10-26 DIAGNOSIS — Z419 Encounter for procedure for purposes other than remedying health state, unspecified: Secondary | ICD-10-CM | POA: Diagnosis not present

## 2021-11-26 DIAGNOSIS — Z419 Encounter for procedure for purposes other than remedying health state, unspecified: Secondary | ICD-10-CM | POA: Diagnosis not present

## 2021-12-10 ENCOUNTER — Ambulatory Visit (INDEPENDENT_AMBULATORY_CARE_PROVIDER_SITE_OTHER): Payer: Medicaid Other | Admitting: Nurse Practitioner

## 2021-12-10 VITALS — BP 120/80 | HR 72 | Temp 98.5°F | Ht 63.0 in | Wt 225.2 lb

## 2021-12-10 DIAGNOSIS — Z0000 Encounter for general adult medical examination without abnormal findings: Secondary | ICD-10-CM | POA: Diagnosis not present

## 2021-12-10 DIAGNOSIS — Z23 Encounter for immunization: Secondary | ICD-10-CM

## 2021-12-10 DIAGNOSIS — Z2821 Immunization not carried out because of patient refusal: Secondary | ICD-10-CM | POA: Diagnosis not present

## 2021-12-10 DIAGNOSIS — Z720 Tobacco use: Secondary | ICD-10-CM

## 2021-12-10 DIAGNOSIS — J3489 Other specified disorders of nose and nasal sinuses: Secondary | ICD-10-CM

## 2021-12-10 DIAGNOSIS — E6609 Other obesity due to excess calories: Secondary | ICD-10-CM

## 2021-12-10 DIAGNOSIS — L918 Other hypertrophic disorders of the skin: Secondary | ICD-10-CM | POA: Diagnosis not present

## 2021-12-10 DIAGNOSIS — Z6839 Body mass index (BMI) 39.0-39.9, adult: Secondary | ICD-10-CM | POA: Diagnosis not present

## 2021-12-10 MED ORDER — AZELASTINE HCL 0.1 % NA SOLN: 2.0000 | Freq: Two times a day (BID) | NASAL | 12 refills | Status: DC

## 2021-12-10 NOTE — Progress Notes (Unsigned)
Barnet [place] Martin,acting as a Education administrator for [name], FNP.,have documented all relevant documentation on the behalf of [name], FNP,as directed by  [name], FNP while in the presence of [name], [place].    Subjective:     Patient ID: Alicia Perry , female    DOB: 10/31/1980 , 41 y.o.   MRN: [id number]   Chief Complaint  Patient presents with   Annual Exam    HPI  Patient presents today for HM. Patient states compliance with medications. Patient states she would like a mole clipped off her neck.   BP Readings from Last 3 Encounters: 12/10/21 : 120/80 03/31/21 : 128/74 03/11/21 : 138/89  Wt Readings from Last 3 Encounters: 12/10/21 : 225 lb 3.2 oz (102.2 kg) 03/31/21 : 223 lb 9.6 oz (101.4 kg) 03/11/21 : 224 lb 12.8 oz (102 kg)        Past Medical History:  Diagnosis Date   No pertinent past medical history      Family History  Problem Relation Age of Onset   Diabetes Father    Hypertension Father    Heart failure Father    Breast cancer Neg Hx      Current Outpatient Medications:    albuterol (VENTOLIN HFA) 108 (90 Base) MCG/ACT inhaler, Inhale 2 puffs into the lungs every 6 (six) hours as needed for wheezing or shortness of breath., Disp: 18 g, Rfl: 1   azelastine (ASTELIN) 0.1 % nasal spray, Place 2 sprays into both nostrils 2 (two) times daily. Use in each nostril as directed, Disp: 30 mL, Rfl: 12   Multiple Vitamin (MULTIVITAMIN WITH MINERALS) TABS tablet, Take 1 tablet by mouth daily., Disp: , Rfl:    imiquimod (ALDARA) 5 % cream, Apply topically 3 (three) times a week. (Patient not taking: Reported on 12/10/2021), Disp: 12 each, Rfl: 0   No Known Allergies    The patient states she uses none for birth control. Patient's last menstrual period was 11/28/2021.. Negative for Dysmenorrhea and Negative for Menorrhagia. Negative for: breast discharge, breast lump(s), breast pain and breast self exam. Associated symptoms include abnormal vaginal  bleeding. Pertinent negatives include abnormal bleeding (hematology), anxiety, decreased libido, depression, difficulty falling sleep, dyspareunia, history of infertility, nocturia, sexual dysfunction, sleep disturbances, urinary incontinence, urinary urgency, vaginal discharge and vaginal itching. Diet regular - she has cut back on her soda intake and drinking increased amounts of water. She is taking magnesium and B6. The patient states her exercise level is minimal - she is exercising a little bit.  . The patient's tobacco use is:  Social History   Tobacco Use  Smoking Status Every Day   Packs/day: 0.75   Years: 20.00   Total pack years: 15.00   Types: Cigarettes  Smokeless Tobacco Never  Tobacco Comments   She quit while she was pregnant, she is down to 12 cigarettes a day - 03/31/2021   She has been exposed to passive smoke. The patient's alcohol use is:  Social History   Substance and Sexual Activity  Alcohol Use Yes   Comment: socially   Additional information: Last pap 2021, next one scheduled for 2024.    Review of Systems  Constitutional: Negative.   HENT: Negative.    Eyes: Negative.   Respiratory: Negative.    Cardiovascular: Negative.   Gastrointestinal: Negative.   Endocrine: Negative.   Genitourinary: Negative.   Musculoskeletal: Negative.   Skin: Negative.   Allergic/Immunologic: Negative.   Neurological: Negative.   Hematological: Negative.  Psychiatric/Behavioral: Negative.       Today's Vitals   12/10/21 1123  BP: 120/80  Pulse: 72  Temp: 98.5 F (36.9 C)  TempSrc: Oral  Weight: 225 lb 3.2 oz (102.2 kg)  Height: _0  (1.6 m)  PainSc: 0-No pain   Body mass index is 39.89 kg/m.  Wt Readings from Last 3 Encounters:  12/10/21 225 lb 3.2 oz (102.2 kg)  03/31/21 223 lb 9.6 oz (101.4 kg)  03/11/21 224 lb 12.8 oz (102 kg)    Objective:  Physical Exam Vitals reviewed.  Constitutional:      General: She is not in acute distress.    Appearance:  Normal appearance. She is well-developed. She is obese.  HENT:     Head: Normocephalic and atraumatic.     Right Ear: Hearing, tympanic membrane, ear canal and external ear normal. There is no impacted cerumen.     Left Ear: Hearing, tympanic membrane, ear canal and external ear normal. There is no impacted cerumen.     Nose:     Comments: Ethmoid sinus pressure bilaterally    Mouth/Throat:     Lips: Pink.     Mouth: Mucous membranes are moist.  Eyes:     General: Lids are normal.     Extraocular Movements: Extraocular movements intact.     Conjunctiva/sclera: Conjunctivae normal.     Pupils: Pupils are equal, round, and reactive to light.     Funduscopic exam:    Right eye: No papilledema.        Left eye: No papilledema.  Neck:     Thyroid: No thyroid mass.     Vascular: No carotid bruit.  Cardiovascular:     Rate and Rhythm: Normal rate and regular rhythm.     Pulses: Normal pulses.     Heart sounds: Normal heart sounds. No murmur heard. Pulmonary:     Effort: Pulmonary effort is normal. No respiratory distress.     Breath sounds: Normal breath sounds. No wheezing.  Chest:     Chest wall: No mass.  Breasts:    Tanner Score is 5.     Right: Normal. No mass or tenderness.     Left: Normal. No mass or tenderness.  Abdominal:     General: Abdomen is flat. Bowel sounds are normal. There is no distension.     Palpations: Abdomen is soft.     Tenderness: There is no abdominal tenderness.  Musculoskeletal:        General: No swelling. Normal range of motion.     Cervical back: Full passive range of motion without pain, normal range of motion and neck supple.     Right lower leg: No edema.     Left lower leg: No edema.  Lymphadenopathy:     Upper Body:     Right upper body: No supraclavicular, axillary or pectoral adenopathy.     Left upper body: No supraclavicular, axillary or pectoral adenopathy.  Skin:    General: Skin is warm and dry.     Capillary Refill: Capillary  refill takes less than 2 seconds.     Comments: Skin tag present to left side of neck  Neurological:     General: No focal deficit present.     Mental Status: She is alert and oriented to person, place, and time.     Cranial Nerves: No cranial nerve deficit.     Sensory: No sensory deficit.  Psychiatric:        Mood and  Affect: Mood normal.        Behavior: Behavior normal.        Thought Content: Thought content normal.        Judgment: Judgment normal.         Assessment And Plan:     1. Annual physical exam Behavior modifications discussed and diet history reviewed.   Pt will continue to exercise regularly and modify diet with low GI, plant based foods and decrease intake of processed foods.  Recommend intake of daily multivitamin, Vitamin D, and calcium.  Recommend mammogram for preventive screenings, as well as recommend immunizations that include influenza, TDAP - CMP14+EGFR  2. Immunization declined Patient declined influenza vaccination at this time. Patient is aware that influenza vaccine prevents illness in 70% of healthy people, and reduces hospitalizations to 30-70% in elderly. This vaccine is recommended annually. Education has been provided regarding the importance of this vaccine but patient still declined. Advised may receive this vaccine at local pharmacy or Health Dept.or vaccine clinic. Aware to provide a copy of the vaccination record if obtained from local pharmacy or Health Dept.  Pt is willing to accept risk associated with refusing vaccination.  3. Need for Tdap vaccination - Tdap vaccine greater than or equal to 7yo IM  4. Tobacco abuse Comments: She has now cut back to 7 cigarettes a day.  5. Class 2 obesity due to excess calories without serious comorbidity with body mass index (BMI) of 39.0 to 39.9 in adult - Hemoglobin A1c - Lipid panel  6. Sinus pressure Comments: Ethmoid sinus pressure bilaterally will treat with steroid nasal spray - CBC no  Diff - azelastine (ASTELIN) 0.1 % nasal spray; Place 2 sprays into both nostrils 2 (two) times daily. Use in each nostril as directed  Dispense: 30 mL; Refill: 12  7. Skin tag Comments: Left side neck with skin tag, will try freeze off at home with over the counter treatment.     Patient was given opportunity to ask questions. Patient verbalized understanding of the plan and was able to repeat key elements of the plan. All questions were answered to their satisfaction.   [name], FNP   I, [name], FNP, have reviewed all documentation for this visit. The documentation on 12/10/21 for the exam, diagnosis, procedures, and orders are all accurate and complete.  THE PATIENT IS ENCOURAGED TO PRACTICE SOCIAL DISTANCING DUE TO THE COVID-19 PANDEMIC.

## 2021-12-10 NOTE — Patient Instructions (Signed)

## 2021-12-11 LAB — CBC
Hematocrit: 42.4 % (ref 34.0–46.6)
Hemoglobin: 14.5 g/dL (ref 11.1–15.9)
MCH: 32.3 pg (ref 26.6–33.0)
MCHC: 34.2 g/dL (ref 31.5–35.7)
MCV: 94 fL (ref 79–97)
Platelets: 400 10*3/uL (ref 150–450)
RBC: 4.49 x10E6/uL (ref 3.77–5.28)
RDW: 10.6 % — ABNORMAL LOW (ref 11.7–15.4)
WBC: 6.2 10*3/uL (ref 3.4–10.8)

## 2021-12-11 LAB — CMP14+EGFR
ALT: 17 IU/L (ref 0–32)
AST: 13 IU/L (ref 0–40)
Albumin/Globulin Ratio: 1.3 (ref 1.2–2.2)
Albumin: 4.2 g/dL (ref 3.9–4.9)
Alkaline Phosphatase: 99 IU/L (ref 44–121)
BUN/Creatinine Ratio: 8 — ABNORMAL LOW (ref 9–23)
BUN: 7 mg/dL (ref 6–24)
Bilirubin Total: 0.3 mg/dL (ref 0.0–1.2)
CO2: 25 mmol/L (ref 20–29)
Calcium: 9.8 mg/dL (ref 8.7–10.2)
Chloride: 102 mmol/L (ref 96–106)
Creatinine, Ser: 0.89 mg/dL (ref 0.57–1.00)
Globulin, Total: 3.3 g/dL (ref 1.5–4.5)
Glucose: 94 mg/dL (ref 70–99)
Potassium: 4.3 mmol/L (ref 3.5–5.2)
Sodium: 139 mmol/L (ref 134–144)
Total Protein: 7.5 g/dL (ref 6.0–8.5)
eGFR: 84 mL/min/{1.73_m2} (ref >59)

## 2021-12-11 LAB — HEMOGLOBIN A1C
Est. average glucose Bld gHb Est-mCnc: 103 mg/dL
Hgb A1c MFr Bld: 5.2 % (ref 4.8–5.6)

## 2021-12-11 LAB — LIPID PANEL
Chol/HDL Ratio: 3.4 ratio (ref 0.0–4.4)
Cholesterol, Total: 157 mg/dL (ref 100–199)
HDL: 46 mg/dL (ref >39)
LDL Chol Calc (NIH): 96 mg/dL (ref 0–99)
Triglycerides: 75 mg/dL (ref 0–149)
VLDL Cholesterol Cal: 15 mg/dL (ref 5–40)

## 2021-12-26 DIAGNOSIS — Z419 Encounter for procedure for purposes other than remedying health state, unspecified: Secondary | ICD-10-CM | POA: Diagnosis not present

## 2022-01-01 ENCOUNTER — Encounter: Payer: Self-pay | Admitting: *Deleted

## 2022-01-06 ENCOUNTER — Encounter: Payer: Self-pay | Admitting: Nurse Practitioner

## 2022-01-08 ENCOUNTER — Other Ambulatory Visit: Payer: Self-pay | Admitting: Nurse Practitioner

## 2022-01-26 DIAGNOSIS — Z419 Encounter for procedure for purposes other than remedying health state, unspecified: Secondary | ICD-10-CM | POA: Diagnosis not present

## 2022-02-26 DIAGNOSIS — Z419 Encounter for procedure for purposes other than remedying health state, unspecified: Secondary | ICD-10-CM | POA: Diagnosis not present

## 2022-03-27 DIAGNOSIS — Z419 Encounter for procedure for purposes other than remedying health state, unspecified: Secondary | ICD-10-CM | POA: Diagnosis not present

## 2022-03-31 ENCOUNTER — Ambulatory Visit (INDEPENDENT_AMBULATORY_CARE_PROVIDER_SITE_OTHER): Payer: Medicaid Other | Admitting: Nurse Practitioner

## 2022-03-31 ENCOUNTER — Encounter: Payer: Self-pay | Admitting: Nurse Practitioner

## 2022-03-31 VITALS — BP 118/82 | HR 76 | Temp 98.1°F | Ht 63.0 in | Wt 229.0 lb

## 2022-03-31 DIAGNOSIS — L918 Other hypertrophic disorders of the skin: Secondary | ICD-10-CM | POA: Diagnosis not present

## 2022-03-31 DIAGNOSIS — L0232 Furuncle of buttock: Secondary | ICD-10-CM | POA: Diagnosis not present

## 2022-03-31 MED ORDER — CEPHALEXIN 500 MG PO CAPS: 500.0000 mg | ORAL_CAPSULE | Freq: Four times a day (QID) | ORAL | 0 refills | Status: AC

## 2022-03-31 NOTE — Progress Notes (Signed)
I,Sheena H Holbrook,acting as a Education administrator for [name], FNP.,have documented all relevant documentation on the behalf of [name], FNP,as directed by  [name], FNP while in the presence of [name], [place].    Subjective:     Patient ID: Alicia Perry , female    DOB: [birth year] , 42 y.o.   MRN: [id number]   Chief Complaint  Patient presents with   Abscess   Nevus    HPI  Patient presents today for possible abscess on her back, has been seen for same area before, crease of buttocks area. Since Feb 26th she has had a reoccurrence of her abscess. Small amount of pain. She notices more when her cycle comes on. She had some drainage.  Patient also reports several small moles on the left side of her neck, have been present 10+ years; denies any changes, bleeding or other concerns. She reports they do get irritated from wearing necklaces.         Past Medical History:  Diagnosis Date   No pertinent past medical history      Family History  Problem Relation Age of Onset   Diabetes Father    Hypertension Father    Heart failure Father    Breast cancer Neg Hx      Current Outpatient Medications:    azelastine (ASTELIN) 0.1 % nasal spray, Place 2 sprays into both nostrils 2 (two) times daily. Use in each nostril as directed, Disp: 30 mL, Rfl: 12   cephALEXin (KEFLEX) 500 MG capsule, Take 1 capsule (500 mg total) by mouth 4 (four) times daily for 10 days., Disp: 40 capsule, Rfl: 0   Multiple Vitamin (MULTIVITAMIN WITH MINERALS) TABS tablet, Take 1 tablet by mouth daily., Disp: , Rfl:    VENTOLIN HFA 108 (90 Base) MCG/ACT inhaler, INHALE 2 PUFFS INTO THE LUNGS EVERY 6 HOURS AS NEEDED FOR WHEEZING OR SHORTNESS OF BREATH, Disp: 18 g, Rfl: 1   No Known Allergies   Review of Systems  Constitutional: Negative.   HENT: Negative.    Eyes: Negative.   Respiratory: Negative.    Cardiovascular: Negative.  Negative for chest pain, palpitations and leg swelling.   Gastrointestinal: Negative.   Endocrine: Negative.   Genitourinary: Negative.   Musculoskeletal: Negative.   Skin: Negative.        Boil to top of buttocks also has mole to left neck  Allergic/Immunologic: Negative.   Neurological: Negative.   Hematological: Negative.   Psychiatric/Behavioral: Negative.       Today's Vitals   03/31/22 0828  BP: 118/82  Pulse: 76  Temp: 98.1 F (36.7 C)  TempSrc: Oral  SpO2: 98%  Weight: 229 lb (103.9 kg)  Height: '5\' 3"'$  (1.6 m)   Body mass index is 40.57 kg/m.   Objective:  Physical Exam Vitals reviewed.  Constitutional:      General: She is not in acute distress.    Appearance: Normal appearance. She is obese. She is not ill-appearing.  Cardiovascular:     Rate and Rhythm: Normal rate and regular rhythm.     Pulses: Normal pulses.     Heart sounds: Normal heart sounds. No murmur heard. Pulmonary:     Effort: Pulmonary effort is normal. No respiratory distress.     Breath sounds: Normal breath sounds.  Skin:    Capillary Refill: Capillary refill takes less than 2 seconds.     Comments: Right upper buttocks near crease with firm vesicle present  Neurological:  General: No focal deficit present.     Mental Status: She is alert and oriented to person, place, and time.     Cranial Nerves: No cranial nerve deficit.  Psychiatric:        Mood and Affect: Mood normal.        Behavior: Behavior normal.        Thought Content: Thought content normal.        Judgment: Judgment normal.         Assessment And Plan:     1. Boil of buttock Comments: Firm raised vesicle to right buttocks, skin is darkened. Will treat for possible infection with cephalexin, if not better may need to refer to Surgery. - cephALEXin (KEFLEX) 500 MG capsule; Take 1 capsule (500 mg total) by mouth 4 (four) times daily for 10 days.  Dispense: 40 capsule; Refill: 0  2. Skin tag Comments: removed skin tag from left side of neck, no irregularities and soft in  nature, cleansed with betadine and NS, applied nitrous stick for bleeding.     Patient was given opportunity to ask questions. Patient verbalized understanding of the plan and was able to repeat key elements of the plan. All questions were answered to their satisfaction.  [name], FNP   I, [name], FNP, have reviewed all documentation for this visit. The documentation on 03/31/22 for the exam, diagnosis, procedures, and orders are all accurate and complete.   IF YOU HAVE BEEN REFERRED TO A SPECIALIST, IT MAY TAKE 1-2 WEEKS TO SCHEDULE/PROCESS THE REFERRAL. IF YOU HAVE NOT HEARD FROM US/SPECIALIST IN TWO WEEKS, PLEASE GIVE [place] A CALL AT [phone] X [phone].   THE PATIENT IS ENCOURAGED TO PRACTICE SOCIAL DISTANCING DUE TO THE COVID-19 PANDEMIC.

## 2022-04-27 DIAGNOSIS — Z419 Encounter for procedure for purposes other than remedying health state, unspecified: Secondary | ICD-10-CM | POA: Diagnosis not present

## 2022-05-08 ENCOUNTER — Encounter: Payer: Self-pay | Admitting: Nurse Practitioner

## 2022-05-11 ENCOUNTER — Other Ambulatory Visit: Payer: Self-pay | Admitting: Nurse Practitioner

## 2022-05-11 DIAGNOSIS — L0232 Furuncle of buttock: Secondary | ICD-10-CM

## 2022-05-27 DIAGNOSIS — Z419 Encounter for procedure for purposes other than remedying health state, unspecified: Secondary | ICD-10-CM | POA: Diagnosis not present

## 2022-05-29 ENCOUNTER — Ambulatory Visit: Payer: Self-pay | Admitting: Surgery

## 2022-05-29 DIAGNOSIS — L0591 Pilonidal cyst without abscess: Secondary | ICD-10-CM | POA: Diagnosis not present

## 2022-05-29 NOTE — H&P (Signed)
     Alicia Perry [id number]   Referring Provider:  Arnette Felts, NP   Subjective   Chief Complaint: New Consultation ( Boil of buttock)     History of Present Illness: 42 year old woman with history of tobacco abuse, obesity, presents for evaluation of recurrent buttock abscess.  This is located at the natal cleft to the right of midline and has been present for about a year, treated with Keflex about 2 months ago with some improvement but denies any prior procedures in this area.  It has intermittently flared, occasionally spontaneously drained, persistently painful.    Review of Systems: A complete review of systems was obtained from the patient.  I have reviewed this information and discussed as appropriate with the patient.  See HPI as well for other ROS.   Medical History: History reviewed. No pertinent past medical history.  There is no problem list on file for this patient.   History reviewed. No pertinent surgical history.   No Known Allergies  No current outpatient medications on file prior to visit.   No current facility-administered medications on file prior to visit.    History reviewed. No pertinent family history.   Social History   Tobacco Use  Smoking Status Every Day   Current packs/day: 0.50   Types: Cigarettes  Smokeless Tobacco Never     Social History   Socioeconomic History   Marital status: Single  Tobacco Use   Smoking status: Every Day    Current packs/day: 0.50    Types: Cigarettes   Smokeless tobacco: Never  Substance and Sexual Activity   Alcohol use: Yes   Drug use: Never   Social Determinants of Health   Food Insecurity: No Food Insecurity (03/11/2021)   Received from [facility]   Hunger Vital Sign    Worried About Running Out of Food in the Last Year: Never true    Ran Out of Food in the Last Year: Never true  Transportation Needs: No Transportation Needs (03/11/2021)   Received from [facility] -  Transportation    Lack of Transportation (Medical): No    Lack of Transportation (Non-Medical): No    Objective:    Vitals:   05/29/22 1020 05/29/22 1026  BP: (!) 146/86   Pulse: 71   Temp: 36.2 C (97.1 F)   SpO2: 98%   Weight: (!) 102.3 kg (225 lb 10.1 oz)   Height: 160 cm (5\' 3" )   PainSc:  0-No pain  PainLoc:  Buttocks    Body mass index is 39.97 kg/m.  Gen: A&Ox3, no distress  Unlabored respirations To the right of midline at the superior aspect of the natal cleft is a approximately 5 mm scar with underlying scar and induration consistent with residual pilonidal cyst.  There is 1 very subtle pore in the midline just inferior to this.  Assessment and Plan:  Diagnoses and all orders for this visit:  Pilonidal cyst    We discussed the etiology and natural history of this disease and options for treatment. I recommend trephination and went over the procedure with the patient in detail as well as typical postoperative recovery and timeline. We discussed risks of bleeding, infection, pain, scarring, wound healing problems, recurrent pilonidal disease. Questions welcomed and answered. The patient wishes to proceed.    Tykisha Areola Carlye Grippe, MD

## 2022-06-09 NOTE — Progress Notes (Signed)
I spoke with Elane Fritz at [facility]. I let her know that the phone # for the patient did not work. And, that I had called the patient's mother to get a working number. The number that the mother gave me was an incorrect number. She is going to let the surgery scheduler know.

## 2022-06-10 ENCOUNTER — Encounter (HOSPITAL_BASED_OUTPATIENT_CLINIC_OR_DEPARTMENT_OTHER): Payer: Self-pay | Admitting: Surgery

## 2022-06-10 ENCOUNTER — Other Ambulatory Visit: Payer: Self-pay

## 2022-06-10 NOTE — Progress Notes (Addendum)
Spoke with wendy at [facility], no other phone numbers for patient, left message with Asencion Islam guy emergency contact to call me back and verify phone number  Spoke with patient, phone numbers updated, pre op phone call completed.  Spoke w/ via phone for pre-op interview---pt Lab needs dos----   urine preg poct            Lab results------none COVID test -----patient states asymptomatic no test needed Arrive at -------530 am 06-19-2022 NPO after MN NO Solid Food.  Clear liquids from MN until---430 am Med rec completed Medications to take morning of surgery -----albuterol inhaler prn/bring inhaler dos Diabetic medication -----n/a Patient instructed no nail polish to be worn day of surgery Patient instructed to bring photo id and insurance card day of surgery Patient aware to have Driver (ride ) / caregiver   mother Asencion Islam guy  for 24 hours after surgery  Patient Special Instructions -----none Pre-Op special Instructions -----none Patient verbalized understanding of instructions that were given at this phone interview. Patient denies shortness of breath, chest pain, fever, cough at this phone interview.

## 2022-06-18 ENCOUNTER — Encounter (HOSPITAL_BASED_OUTPATIENT_CLINIC_OR_DEPARTMENT_OTHER): Payer: Self-pay | Admitting: Anesthesiology

## 2022-06-18 NOTE — Anesthesia Preprocedure Evaluation (Deleted)
Anesthesia Evaluation    Reviewed: Allergy & Precautions, Patient's Chart, lab work & pertinent test results  Airway        Dental   Pulmonary asthma , Current Smoker          Cardiovascular negative cardio ROS      Neuro/Psych negative neurological ROS     GI/Hepatic negative GI ROS,,,(+)     substance abuse  marijuana use  Endo/Other  negative endocrine ROS    Renal/GU negative Renal ROS     Musculoskeletal negative musculoskeletal ROS (+)    Abdominal   Peds  Hematology negative hematology ROS (+)   Anesthesia Other Findings   Reproductive/Obstetrics                             Anesthesia Physical Anesthesia Plan  ASA: 2  Anesthesia Plan: MAC   Post-op Pain Management: Tylenol PO (pre-op)*   Induction: Intravenous  PONV Risk Score and Plan: 1 and Midazolam, TIVA, Dexamethasone and Ondansetron  Airway Management Planned: Natural Airway and Simple Face Mask  Additional Equipment:   Intra-op Plan:   Post-operative Plan:   Informed Consent:   Plan Discussed with:   Anesthesia Plan Comments:        Anesthesia Quick Evaluation

## 2022-06-19 ENCOUNTER — Ambulatory Visit (HOSPITAL_BASED_OUTPATIENT_CLINIC_OR_DEPARTMENT_OTHER): Admission: RE | Admit: 2022-06-19 | Payer: Medicaid Other | Source: Home / Self Care | Admitting: Surgery

## 2022-06-19 DIAGNOSIS — Z01818 Encounter for other preprocedural examination: Secondary | ICD-10-CM

## 2022-06-19 SURGERY — EXCISION, PILONIDAL CYST, EXTENSIVE
Anesthesia: Monitor Anesthesia Care

## 2022-06-27 DIAGNOSIS — Z419 Encounter for procedure for purposes other than remedying health state, unspecified: Secondary | ICD-10-CM | POA: Diagnosis not present

## 2022-07-27 DIAGNOSIS — Z419 Encounter for procedure for purposes other than remedying health state, unspecified: Secondary | ICD-10-CM | POA: Diagnosis not present

## 2022-08-27 DIAGNOSIS — Z419 Encounter for procedure for purposes other than remedying health state, unspecified: Secondary | ICD-10-CM | POA: Diagnosis not present

## 2022-09-27 DIAGNOSIS — Z419 Encounter for procedure for purposes other than remedying health state, unspecified: Secondary | ICD-10-CM | POA: Diagnosis not present

## 2022-09-30 ENCOUNTER — Other Ambulatory Visit: Payer: Self-pay | Admitting: Nurse Practitioner

## 2022-09-30 DIAGNOSIS — Z1231 Encounter for screening mammogram for malignant neoplasm of breast: Secondary | ICD-10-CM

## 2022-10-01 ENCOUNTER — Ambulatory Visit: Payer: Self-pay | Admitting: Surgery

## 2022-10-01 DIAGNOSIS — L0591 Pilonidal cyst without abscess: Secondary | ICD-10-CM | POA: Diagnosis not present

## 2022-10-01 IMAGING — MG DIGITAL SCREENING BILAT W/ CAD
5 series · 5 of 5 positions shown · non-contrast
Comparison: None.

CLINICAL DATA: Screening.

EXAM:
DIGITAL SCREENING BILATERAL MAMMOGRAM WITH CAD
TECHNIQUE: Bilateral screening digital craniocaudal and mediolateral oblique
mammograms were obtained. The images were evaluated with
computer-aided detection.

[R XCCL]
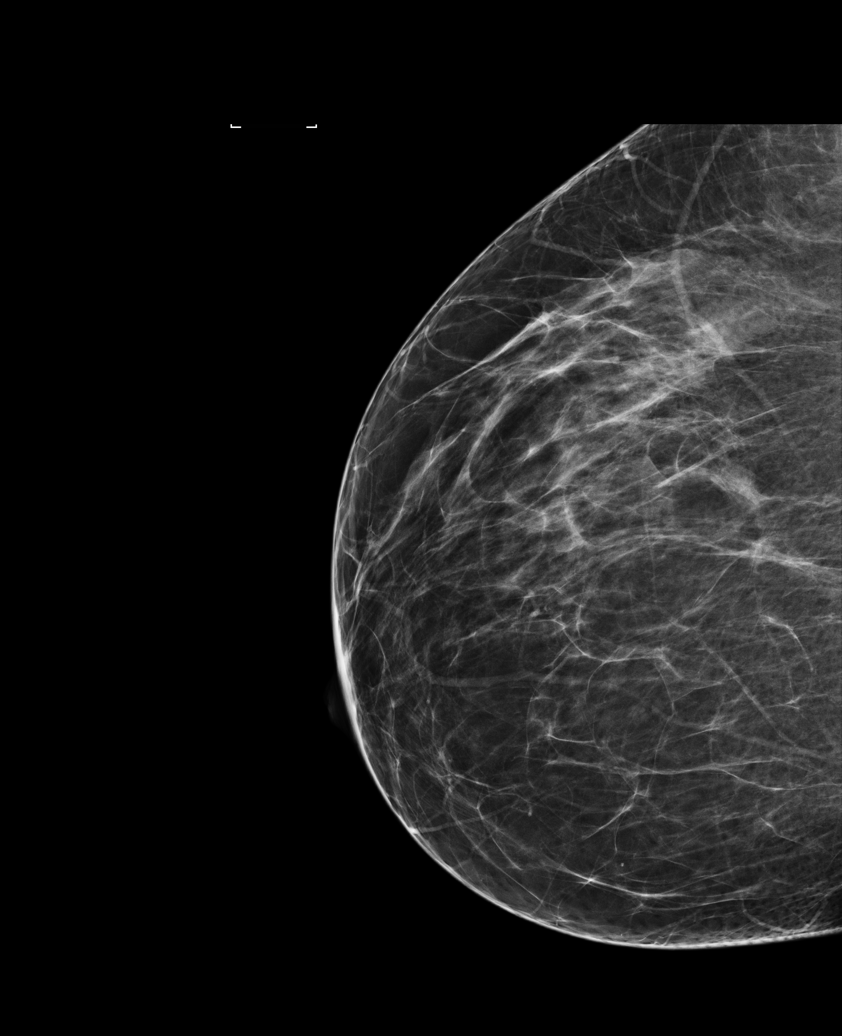

[R MLO]
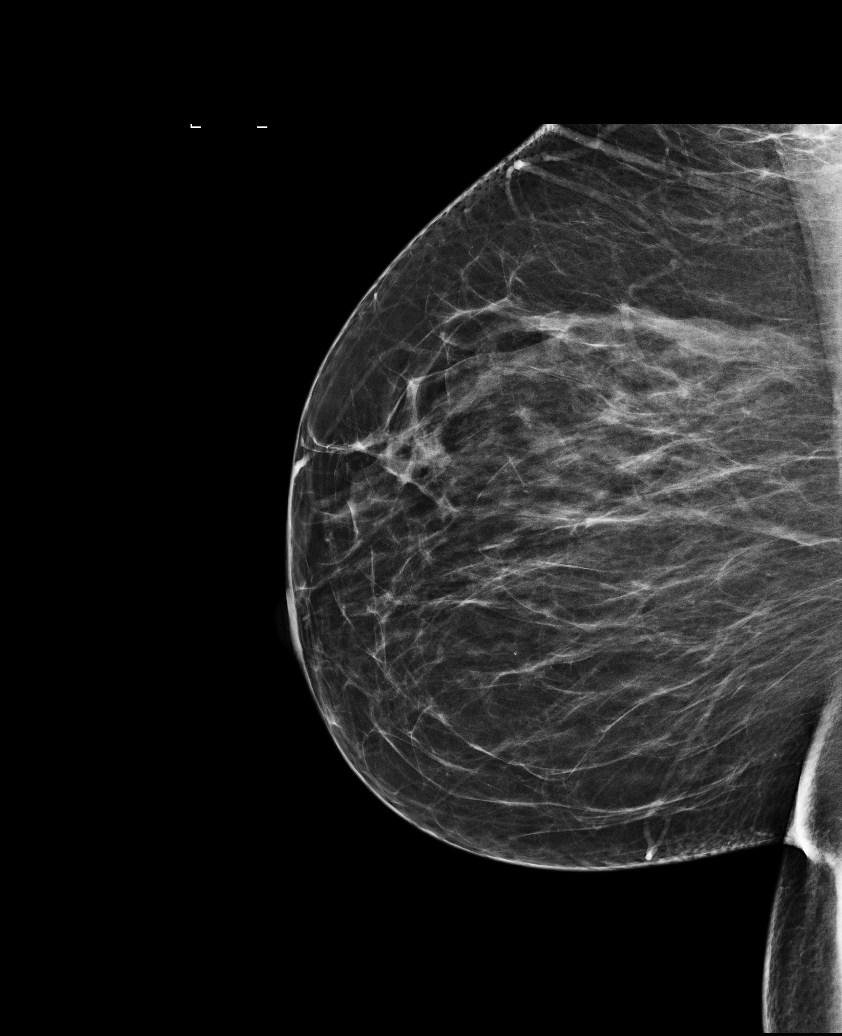

[L MLO]
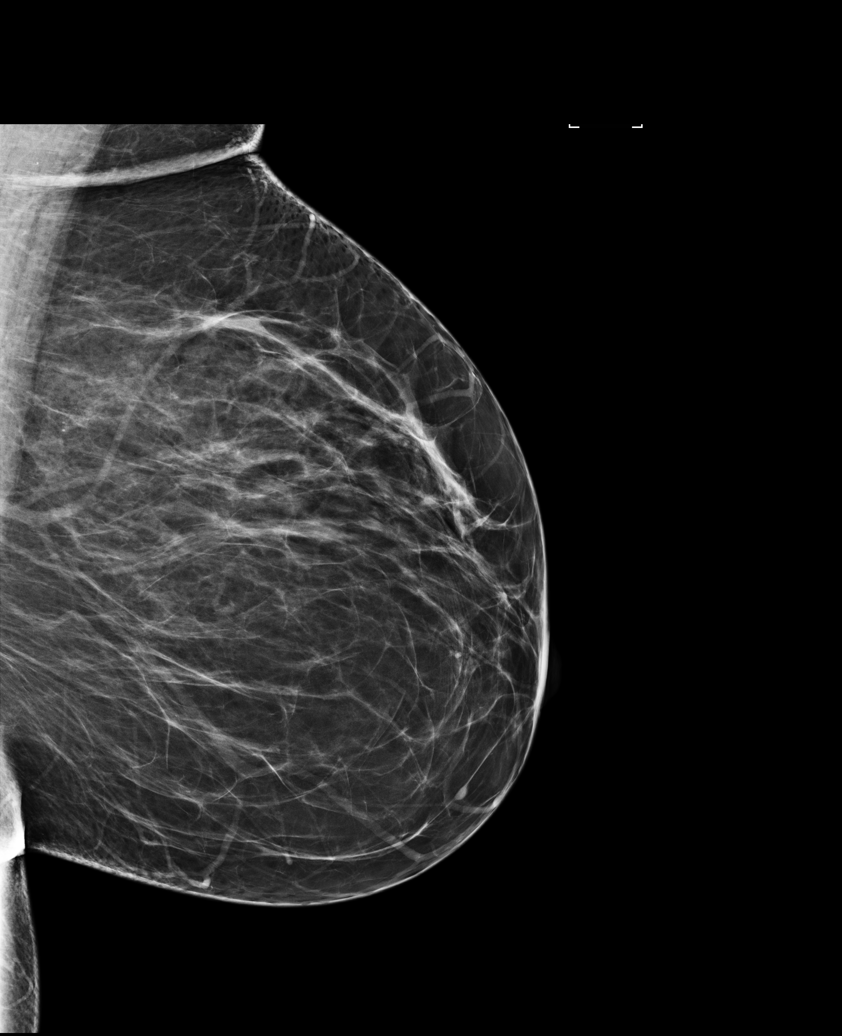

[L CC]
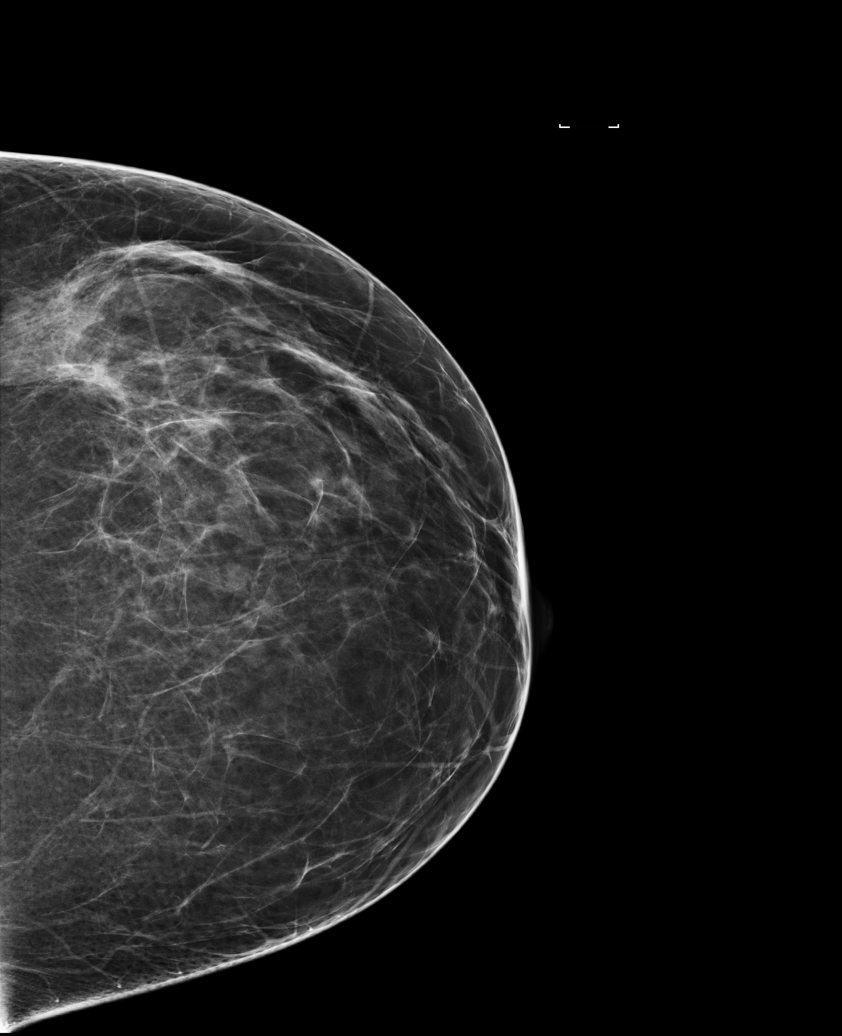

[R CC]
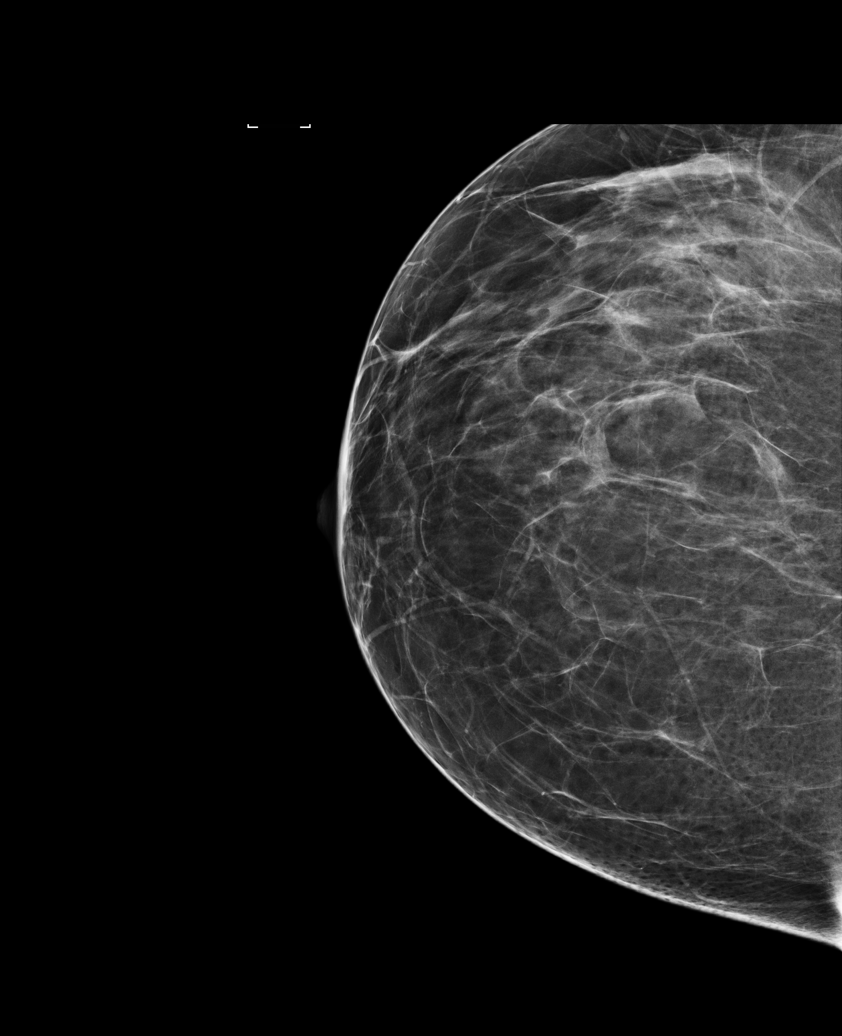

[5 of 5 positions shown; findings below may reference images not displayed]

ACR Breast Density Category b: There are scattered areas of
fibroglandular density.
FINDINGS: There are no findings suspicious for malignancy.
IMPRESSION: No mammographic evidence of malignancy. A result letter of this
screening mammogram will be mailed directly to the patient.

RECOMMENDATION:
Screening mammogram in one year. (Code:1U-[id number])

BI-RADS CATEGORY  1: Negative.

## 2022-10-01 NOTE — H&P (Signed)
Galit Corado [id number]   Referring Provider:  Self   Subjective   Chief Complaint: No chief complaint on file.     History of Present Illness: Patient returns for follow-up.  She was scheduled for trephination of pilonidal cyst on 5/24.  Preop was unable to reach her on 5/15; he called the office noting possible allergies on 5/22, and then apparently called our office before surgery and left a message on one of the schedulers' voicemails but he did not know she would not be coming and then did not show up for surgery when scheduled and was not reachable on the day of surgery.  This has continued to bother her.  She has not had any procedures in the interim but it has drained intermittently.  Initial consult, May of this year: 42 year old woman with history of tobacco abuse, obesity, presents for evaluation of recurrent buttock abscess.  This is located at the natal cleft to the right of midline and has been present for about a year, treated with Keflex about 2 months ago with some improvement but denies any prior procedures in this area.  It has intermittently flared, occasionally spontaneously drained, persistently painful.    Review of Systems: A complete review of systems was obtained from the patient.  I have reviewed this information and discussed as appropriate with the patient.  See HPI as well for other ROS.   Medical History: No past medical history on file.  There is no problem list on file for this patient.   No past surgical history on file.   No Known Allergies  No current outpatient medications on file prior to visit.   No current facility-administered medications on file prior to visit.    No family history on file.   Social History   Tobacco Use  Smoking Status Every Day   Current packs/day: 0.50   Types: Cigarettes  Smokeless Tobacco Never     Social History   Socioeconomic History   Marital status: Single  Tobacco Use   Smoking status: Every  Day    Current packs/day: 0.50    Types: Cigarettes   Smokeless tobacco: Never  Substance and Sexual Activity   Alcohol use: Yes   Drug use: Never   Social Determinants of Health   Food Insecurity: No Food Insecurity (03/11/2021)   Received from [facility]   Hunger Vital Sign    Worried About Running Out of Food in the Last Year: Never true    Ran Out of Food in the Last Year: Never true  Transportation Needs: No Transportation Needs (03/11/2021)   Received from [facility] - Transportation    Lack of Transportation (Medical): No    Lack of Transportation (Non-Medical): No    Objective:    There were no vitals filed for this visit.   There is no height or weight on file to calculate BMI.  Gen: A&Ox3, no distress  Unlabored respirations To the right of midline at the superior aspect of the natal cleft is a approximately 5 mm scar with underlying scar and induration consistent with residual pilonidal cyst.  There is 1 very subtle pore in the midline just inferior to this.  Assessment and Plan:  Diagnoses and all orders for this visit:  Pilonidal cyst     We previously discussed the etiology and natural history of this disease and options for treatment, as well as the recommended procedure including typical postoperative recovery and timeline. We discussed risks of  bleeding, infection, pain, scarring, wound healing problems, recurrent pilonidal disease. Questions welcomed and answered. The patient wishes to proceed.    Moroni Nester Carlye Grippe, MD

## 2022-10-08 ENCOUNTER — Ambulatory Visit
Admission: RE | Admit: 2022-10-08 | Discharge: 2022-10-08 | Disposition: A | Discharge status: Home / Self Care | Payer: Medicaid Other | Source: Ambulatory Visit | Attending: Nurse Practitioner | Admitting: Nurse Practitioner

## 2022-10-08 DIAGNOSIS — Z1231 Encounter for screening mammogram for malignant neoplasm of breast: Secondary | ICD-10-CM | POA: Diagnosis not present

## 2022-10-13 ENCOUNTER — Other Ambulatory Visit: Payer: Self-pay | Admitting: Nurse Practitioner

## 2022-10-22 ENCOUNTER — Encounter (HOSPITAL_BASED_OUTPATIENT_CLINIC_OR_DEPARTMENT_OTHER): Payer: Self-pay | Admitting: Surgery

## 2022-10-22 NOTE — Progress Notes (Signed)
Spoke w/ via phone for pre-op interview--- [facility] Lab needs dos----     UPT per anesthesia    Lab results------ COVID test -----patient states asymptomatic no test needed Arrive at -------0630 NPO after MN NO Solid Food.   Med rec completed Medications to take morning of surgery ----- Bring Albuterol inhaler Diabetic medication ----- Patient instructed no nail polish to be worn day of surgery Patient instructed to bring photo id and insurance card day of surgery Patient aware to have Driver (ride ) / caregiver    for 24 hours after surgery - Marva Guy-mother Patient Special Instructions ----- Pre-Op special Instructions ----- Patient verbalized understanding of instructions that were given at this phone interview. Patient denies chest pain, sob, fever, cough at the interview.

## 2022-10-27 DIAGNOSIS — Z419 Encounter for procedure for purposes other than remedying health state, unspecified: Secondary | ICD-10-CM | POA: Diagnosis not present

## 2022-11-03 NOTE — Anesthesia Preprocedure Evaluation (Signed)
Anesthesia Evaluation  Patient identified by MRN, date of birth, ID band Patient awake    Reviewed: Allergy & Precautions, NPO status , Patient's Chart, lab work & pertinent test results  History of Anesthesia Complications Negative for: history of anesthetic complications  Airway Mallampati: II  TM Distance: >3 FB Neck ROM: Full    Dental  (+) Dental Advisory Given, Teeth Intact   Pulmonary asthma , Current Smoker and Patient abstained from smoking.   Pulmonary exam normal        Cardiovascular negative cardio ROS Normal cardiovascular exam     Neuro/Psych negative neurological ROS  negative psych ROS   GI/Hepatic negative GI ROS, Neg liver ROS,,,  Endo/Other   Obesity   Renal/GU negative Renal ROS     Musculoskeletal negative musculoskeletal ROS (+)    Abdominal  (+) + obese  Peds  Hematology negative hematology ROS (+)   Anesthesia Other Findings   Reproductive/Obstetrics                             Anesthesia Physical Anesthesia Plan  ASA: 2  Anesthesia Plan: MAC   Post-op Pain Management: Tylenol PO (pre-op)* and Minimal or no pain anticipated   Induction:   PONV Risk Score and Plan: 1 and Propofol infusion and Treatment may vary due to age or medical condition  Airway Management Planned: Natural Airway and Simple Face Mask  Additional Equipment: None  Intra-op Plan:   Post-operative Plan:   Informed Consent: I have reviewed the patients History and Physical, chart, labs and discussed the procedure including the risks, benefits and alternatives for the proposed anesthesia with the patient or authorized representative who has indicated his/her understanding and acceptance.       Plan Discussed with: CRNA and Anesthesiologist  Anesthesia Plan Comments:        Anesthesia Quick Evaluation

## 2022-11-03 NOTE — H&P (Signed)
Alicia Perry [id number]    Referring Provider:  Self     Subjective    Chief Complaint: No chief complaint on file.       History of Present Illness: Patient returns for follow-up.  She was scheduled for trephination of pilonidal cyst on 5/24.  Preop was unable to reach her on 5/15; she called the office noting possible allergies on 5/22, and then apparently called our office before surgery and left a message on one of the schedulers' voicemails to inform she would not be coming and then did not show up for surgery when scheduled and was not reachable on the day of surgery.   This has continued to bother her.  She has not had any procedures in the interim but it has drained intermittently.   Initial consult, May of this year: 42 year old woman with history of tobacco abuse, obesity, presents for evaluation of recurrent buttock abscess.  This is located at the natal cleft to the right of midline and has been present for about a year, treated with Keflex about 2 months ago with some improvement but denies any prior procedures in this area.  It has intermittently flared, occasionally spontaneously drained, persistently painful.       Review of Systems: A complete review of systems was obtained from the patient.  I have reviewed this information and discussed as appropriate with the patient.  See HPI as well for other ROS.     Medical History: No past medical history on file.   There is no problem list on file for this patient.     No past surgical history on file.    No Known Allergies   No current outpatient medications on file prior to visit.    No current facility-administered medications on file prior to visit.      No family history on file.    Social History        Tobacco Use  Smoking Status Every Day   Current packs/day: 0.50   Types: Cigarettes  Smokeless Tobacco Never      Social History         Socioeconomic History   Marital status: Single   Tobacco Use   Smoking status: Every Day      Current packs/day: 0.50      Types: Cigarettes   Smokeless tobacco: Never  Substance and Sexual Activity   Alcohol use: Yes   Drug use: Never    Social Determinants of Health        Food Insecurity: No Food Insecurity (03/11/2021)    Received from [facility]    Hunger Vital Sign     Worried About Running Out of Food in the Last Year: Never true     Ran Out of Food in the Last Year: Never true  Transportation Needs: No Transportation Needs (03/11/2021)    Received from [facility] - Transportation     Lack of Transportation (Medical): No     Lack of Transportation (Non-Medical): No      Objective:      There were no vitals filed for this visit.   There is no height or weight on file to calculate BMI.   Gen: A&Ox3, no distress  Unlabored respirations To the right of midline at the superior aspect of the natal cleft is a approximately 5 mm scar with underlying scar and induration consistent with residual pilonidal cyst.  There is 1 very subtle pore in  the midline just inferior to this.   Assessment and Plan:  Diagnoses and all orders for this visit:   Pilonidal cyst       We previously discussed the etiology and natural history of this disease and options for treatment, as well as the recommended procedure including typical postoperative recovery and timeline. We discussed risks of bleeding, infection, pain, scarring, wound healing problems, recurrent pilonidal disease. Questions welcomed and answered. The patient wishes to proceed.      Haruo Stepanek Carlye Grippe, MD

## 2022-11-04 ENCOUNTER — Encounter (HOSPITAL_BASED_OUTPATIENT_CLINIC_OR_DEPARTMENT_OTHER): Payer: Self-pay | Admitting: Surgery

## 2022-11-04 ENCOUNTER — Ambulatory Visit (HOSPITAL_BASED_OUTPATIENT_CLINIC_OR_DEPARTMENT_OTHER): Payer: Medicaid Other | Admitting: Anesthesiology

## 2022-11-04 ENCOUNTER — Ambulatory Visit (HOSPITAL_BASED_OUTPATIENT_CLINIC_OR_DEPARTMENT_OTHER)
Admission: RE | Admit: 2022-11-04 | Discharge: 2022-11-04 | Disposition: A | Discharge status: Home / Self Care | Payer: Medicaid Other | Attending: Surgery | Admitting: Surgery

## 2022-11-04 ENCOUNTER — Encounter (HOSPITAL_BASED_OUTPATIENT_CLINIC_OR_DEPARTMENT_OTHER)
Admission: RE | Disposition: A | Discharge status: Home / Self Care | Payer: Self-pay | Source: Home / Self Care | Attending: Surgery

## 2022-11-04 ENCOUNTER — Ambulatory Visit (HOSPITAL_BASED_OUTPATIENT_CLINIC_OR_DEPARTMENT_OTHER): Payer: Self-pay | Admitting: Anesthesiology

## 2022-11-04 ENCOUNTER — Other Ambulatory Visit: Payer: Self-pay

## 2022-11-04 DIAGNOSIS — Z6839 Body mass index (BMI) 39.0-39.9, adult: Secondary | ICD-10-CM | POA: Insufficient documentation

## 2022-11-04 DIAGNOSIS — J45909 Unspecified asthma, uncomplicated: Secondary | ICD-10-CM | POA: Insufficient documentation

## 2022-11-04 DIAGNOSIS — E669 Obesity, unspecified: Secondary | ICD-10-CM | POA: Insufficient documentation

## 2022-11-04 DIAGNOSIS — F17210 Nicotine dependence, cigarettes, uncomplicated: Secondary | ICD-10-CM | POA: Diagnosis not present

## 2022-11-04 DIAGNOSIS — L0591 Pilonidal cyst without abscess: Secondary | ICD-10-CM | POA: Insufficient documentation

## 2022-11-04 DIAGNOSIS — Z01818 Encounter for other preprocedural examination: Secondary | ICD-10-CM

## 2022-11-04 HISTORY — PX: PILONIDAL CYST EXCISION: SHX744

## 2022-11-04 LAB — POCT PREGNANCY, URINE: Preg Test, Ur: NEGATIVE

## 2022-11-04 SURGERY — EXCISION, PILONIDAL CYST, EXTENSIVE
Anesthesia: Monitor Anesthesia Care

## 2022-11-04 MED ORDER — CEFAZOLIN SODIUM-DEXTROSE 2-4 GM/100ML-% IV SOLN
INTRAVENOUS | Status: AC
  Filled 2022-11-04: qty 100

## 2022-11-04 MED ORDER — GABAPENTIN 300 MG PO CAPS
ORAL_CAPSULE | ORAL | Status: AC
  Filled 2022-11-04: qty 1

## 2022-11-04 MED ORDER — PROPOFOL 1000 MG/100ML IV EMUL
INTRAVENOUS | Status: AC
  Filled 2022-11-04: qty 100

## 2022-11-04 MED ORDER — DOCUSATE SODIUM 100 MG PO CAPS: 100.0000 mg | ORAL_CAPSULE | Freq: Two times a day (BID) | ORAL | 0 refills | Status: AC

## 2022-11-04 MED ORDER — FENTANYL CITRATE (PF) 100 MCG/2ML IJ SOLN
INTRAMUSCULAR | Status: DC | PRN
  Administered 2022-11-04 (×2): 25 ug via INTRAVENOUS

## 2022-11-04 MED ORDER — DEXMEDETOMIDINE HCL IN NACL 80 MCG/20ML IV SOLN
INTRAVENOUS | Status: AC
  Filled 2022-11-04: qty 20

## 2022-11-04 MED ORDER — 0.9 % SODIUM CHLORIDE (POUR BTL) OPTIME
TOPICAL | Status: DC | PRN
Start: 2022-11-04 — End: 2022-11-04
  Administered 2022-11-04: 500 mL

## 2022-11-04 MED ORDER — FENTANYL CITRATE (PF) 100 MCG/2ML IJ SOLN
INTRAMUSCULAR | Status: AC
  Filled 2022-11-04: qty 2

## 2022-11-04 MED ORDER — ACETAMINOPHEN 500 MG PO TABS
1000.0000 mg | ORAL_TABLET | ORAL | Status: AC
  Administered 2022-11-04: 1000 mg via ORAL

## 2022-11-04 MED ORDER — TRAMADOL HCL 50 MG PO TABS
50.0000 mg | ORAL_TABLET | Freq: Four times a day (QID) | ORAL | 0 refills | Status: AC | PRN
Start: 2022-11-04 — End: 2022-11-09

## 2022-11-04 MED ORDER — MIDAZOLAM HCL 2 MG/2ML IJ SOLN
INTRAMUSCULAR | Status: AC
  Filled 2022-11-04: qty 2

## 2022-11-04 MED ORDER — HYDROGEN PEROXIDE 3 % EX SOLN
CUTANEOUS | Status: DC | PRN
  Administered 2022-11-04: 1

## 2022-11-04 MED ORDER — CHLORHEXIDINE GLUCONATE 4 % EX SOLN: 60.0000 mL | Freq: Once | CUTANEOUS | Status: DC

## 2022-11-04 MED ORDER — ONDANSETRON HCL 4 MG/2ML IJ SOLN
INTRAMUSCULAR | Status: DC | PRN
  Administered 2022-11-04: 4 mg via INTRAVENOUS

## 2022-11-04 MED ORDER — ONDANSETRON HCL 4 MG/2ML IJ SOLN
INTRAMUSCULAR | Status: AC
  Filled 2022-11-04: qty 2

## 2022-11-04 MED ORDER — BUPIVACAINE-EPINEPHRINE 0.25% -1:200000 IJ SOLN
INTRAMUSCULAR | Status: DC | PRN
  Administered 2022-11-04: 28 mL

## 2022-11-04 MED ORDER — PROPOFOL 500 MG/50ML IV EMUL
INTRAVENOUS | Status: DC | PRN
  Administered 2022-11-04: 125 ug/kg/min via INTRAVENOUS

## 2022-11-04 MED ORDER — MIDAZOLAM HCL 5 MG/5ML IJ SOLN
INTRAMUSCULAR | Status: DC | PRN
  Administered 2022-11-04: 2 mg via INTRAVENOUS

## 2022-11-04 MED ORDER — LACTATED RINGERS IV SOLN: INTRAVENOUS | Status: DC

## 2022-11-04 MED ORDER — GABAPENTIN 300 MG PO CAPS
300.0000 mg | ORAL_CAPSULE | ORAL | Status: AC
  Administered 2022-11-04: 300 mg via ORAL

## 2022-11-04 MED ORDER — DEXMEDETOMIDINE HCL IN NACL 200 MCG/50ML IV SOLN
INTRAVENOUS | Status: DC | PRN
  Administered 2022-11-04 (×3): 4 ug via INTRAVENOUS

## 2022-11-04 MED ORDER — PROPOFOL 10 MG/ML IV BOLUS
INTRAVENOUS | Status: DC | PRN
  Administered 2022-11-04 (×2): 20 mg via INTRAVENOUS

## 2022-11-04 MED ORDER — ACETAMINOPHEN 500 MG PO TABS
ORAL_TABLET | ORAL | Status: AC
  Filled 2022-11-04: qty 2

## 2022-11-04 MED ORDER — CEFAZOLIN SODIUM-DEXTROSE 2-4 GM/100ML-% IV SOLN
2.0000 g | INTRAVENOUS | Status: AC
  Administered 2022-11-04: 2 g via INTRAVENOUS

## 2022-11-04 SURGICAL SUPPLY — 36 items
BLADE SURG 15 STRL LF DISP TIS (BLADE) ×2 IMPLANT
BLADE SURG 15 STRL SS (BLADE) ×1
BRIEF MESH DISP LRG (UNDERPADS AND DIAPERS) IMPLANT
COVER BACK TABLE 60X90IN (DRAPES) ×2 IMPLANT
COVER MAYO STAND STRL (DRAPES) ×2 IMPLANT
DRAPE LAPAROTOMY 100X72 PEDS (DRAPES) ×2 IMPLANT
DRAPE UTILITY XL STRL (DRAPES) ×2 IMPLANT
ELECT REM PT RETURN 9FT ADLT (ELECTROSURGICAL) ×1
ELECTRODE REM PT RTRN 9FT ADLT (ELECTROSURGICAL) ×2 IMPLANT
GAUZE 4X4 16PLY ~~LOC~~+RFID DBL (SPONGE) ×2 IMPLANT
GAUZE PAD ABD 8X10 STRL (GAUZE/BANDAGES/DRESSINGS) ×2 IMPLANT
GAUZE SPONGE 4X4 12PLY STRL (GAUZE/BANDAGES/DRESSINGS) ×2 IMPLANT
GLOVE BIO SURGEON STRL SZ 6 (GLOVE) ×2 IMPLANT
GLOVE BIOGEL PI IND STRL 6.5 (GLOVE) IMPLANT
GLOVE BIOGEL PI IND STRL 7.0 (GLOVE) IMPLANT
GLOVE INDICATOR 6.5 STRL GRN (GLOVE) ×2 IMPLANT
GLOVE SURG SS PI 6.5 STRL IVOR (GLOVE) IMPLANT
GLOVE SURG SS PI 7.0 STRL IVOR (GLOVE) IMPLANT
GOWN STRL REUS W/ TWL LRG LVL3 (GOWN DISPOSABLE) IMPLANT
GOWN STRL REUS W/TWL LRG LVL3 (GOWN DISPOSABLE) ×3 IMPLANT
HYDROGEN PEROXIDE 16OZ (MISCELLANEOUS) ×2 IMPLANT
KIT TURNOVER CYSTO (KITS) ×2 IMPLANT
NDL HYPO 25X1 1.5 SAFETY (NEEDLE) ×2 IMPLANT
NDL SAFETY ECLIP 18X1.5 (MISCELLANEOUS) IMPLANT
NEEDLE HYPO 25X1 1.5 SAFETY (NEEDLE) ×1
NS IRRIG 500ML POUR BTL (IV SOLUTION) ×2 IMPLANT
PACK BASIN DAY SURGERY FS (CUSTOM PROCEDURE TRAY) ×2 IMPLANT
PENCIL SMOKE EVACUATOR (MISCELLANEOUS) ×2 IMPLANT
PUNCH BIOPSY DERMAL 3 (INSTRUMENTS) IMPLANT
SLEEVE SCD COMPRESS KNEE MED (STOCKING) ×2 IMPLANT
SUCTION TUBE FRAZIER 10FR DISP (SUCTIONS) IMPLANT
SYR 30ML LL (SYRINGE) ×2 IMPLANT
SYR CONTROL 10ML LL (SYRINGE) ×4 IMPLANT
TOWEL OR 17X24 6PK STRL BLUE (TOWEL DISPOSABLE) ×4 IMPLANT
TRAY DSU PREP LF (CUSTOM PROCEDURE TRAY) ×2 IMPLANT
TUBE CONNECTING 12X1/4 (SUCTIONS) ×2 IMPLANT

## 2022-11-04 NOTE — Transfer of Care (Signed)
Immediate Anesthesia Transfer of Care Note  Patient: Alicia Perry  Procedure(s) Performed: TREPHINATION CYST PILONIDAL EXTENSIVE  Patient Location: PACU  Anesthesia Type:MAC  Level of Consciousness: awake, alert , and oriented  Airway & Oxygen Therapy: Patient Spontanous Breathing and Patient connected to face mask oxygen  Post-op Assessment: Report given to RN and Post -op Vital signs reviewed and stable  Post vital signs: Reviewed and stable  Last Vitals:  Vitals Value Taken Time  BP 113/72 11/04/22 0911  Temp    Pulse 52 11/04/22 0913  Resp 17 11/04/22 0913  SpO2 100 % 11/04/22 0913  Vitals shown include unfiled device data.  Last Pain:  Vitals:   11/04/22 0709  TempSrc: Oral  PainSc: 1       Patients Stated Pain Goal: 5 (11/04/22 0709)  Complications: No notable events documented.

## 2022-11-04 NOTE — Op Note (Signed)
Operative Note  SAHEJ HAUSWIRTH  [id number]  [id number]  11/04/2022   Surgeon: Phylliss Blakes MD FACS   Procedure performed: trephination of pilonidal cyst   Preop diagnosis: pilonidal cyst Post-op diagnosis/intraop findings: same   Specimens: no  Retained items: no  EBL: minimal cc Complications: none   Description of procedure: After confirming informed consent the patient was taken to the operating room and placed prone on the operating room table where monitored anesthesia care was initiated, preoperative antibiotics were administered, SCDs applied, and a formal timeout was performed.  The buttocks and natal cleft region were prepped and draped in the usual sterile fashion.  There is a scar with underlying subcutaneous scar tissue to the right of midline at the superior aspect of the natal cleft.  Inferior to this is a very subtle midline poor.  The scar overlying the pilonidal cyst was excised with cautery and chronic subcutaneous scar tissue excised, following the sinus tract inferomedially.  No obvious entrapped hair or fibers were noted, and there was fibrotic scar tissue but not any significant mount of chronic granulation tissue.  A lacrimal duct probe was then used to probe the wound and the tract was identified and followed inferomedially to a midline pore, which was unroofed with a 3 mm punch.  The tract was flushed with hydrogen peroxide followed by sterile saline to debride any inspissated material.  The wound was inspected and confirmed to be hemostatic and then each opening was gently packed with a 4 x 4 dampened with 0.25% percent Marcaine with epinephrine.  Dry dressings were then applied.  The patient was then awakened and taken to PACU in stable condition.    All counts were correct at the completion of the case.

## 2022-11-04 NOTE — Anesthesia Procedure Notes (Signed)
Date/Time: 11/04/2022 7:30 AM  Performed by: Josiah Nieto D, CRNAOxygen Delivery Method: Simple face mask

## 2022-11-04 NOTE — Discharge Instructions (Addendum)
Postoperative Instructions Surgery for Pilonidal Disease Restrictions  You may shower after 24 hours but you should avoid soaking in water for more than ten minutes. Allow the soap and water to run over the wound and rinse with warm water. Pat dry and apply a new dry dressing.   There are no dietary restrictions.  You should avoid alcohol while you are on narcotic pain medication.  Activity can be as tolerated.  Wound care - Your incision was intentionally left open. You should cover the incision with gauze secured with tape. This should be changed at least daily or whenever the dressing gets wet. The wound will heal over the next few weeks.  - Hair removal using clippers, waxing or depilatory creams around the area will help reduce the chance of recurrence. This can be initiated once the wound starts to heal.    Medications  You will be given a prescription for pain medicine when you are discharged from the hospital. In addition to the prescription pain medicine that you were given, you may take Motrin, Advil, or ibuprofen at the same time. This often gives better pain relief than either one by itself.  Stop the prescription and switch to Motrin, Advil, or ibuprofen as soon as these medications can control your pain. (This will reduce your risk of constipation.) -Most patients will experience some swelling and bruising around the incisions.  Ice packs or heating pads (30-60 minutes up to 6 times a day) will help. Use ice for the first few days to help decrease swelling and bruising, then switch to heat to help relax tight/sore spots and speed recovery.  Some people prefer to use ice alone, heat alone, alternating between ice & heat.  Experiment to what works for you.  Swelling and bruising can take several weeks to resolve.  Take Colace 100 mg two times daily until you are seen in the office for your followup visit.  Call the office if:  The pain worsens and you need to increase the  amount of pain medication.  You experience persistent fever or chills. (You may have lowgrade fevers on and off for the days following surgery? this is your body's normal reaction to surgery.)  There is redness of more than  inch around the incisions.  There is drainage of cloudy fluid or pus (Drainage of a yellowish bloody fluid is normal.)  You experience persistent nausea or vomiting.   Please call the office [phone]) to make a followup appointment for one to two weeks after surgery and if you have any other problems, questions, or concerns.   The clinic staff is available to answer your questions during regular business hours (8:30am-5pm).  Please don't hesitate to call and ask to speak to one of our nurses for clinical concerns.              If you have disability or family leave forms, bring them to the office for processing. Do not give them to your doctor.  If you have a medical emergency, go to the nearest emergency room or call 911.  A surgeon from [facility] is always on call at the [facility], [place] [address], [address], [place], [place]  [address] ? MAIN: [phone] ? TOLL FREE: [phone] ?  FAX [phone] [email or web address]     No acetaminophen/Tylenol until after 1:15pm today if needed for pain.     Post Anesthesia Home Care Instructions  Activity: Get plenty  of rest for the remainder of the day. A responsible individual must stay with you for 24 hours following the procedure.  For the next 24 hours, DO NOT: -Drive a car -Advertising copywriter -Drink alcoholic beverages -Take any medication unless instructed by your physician -Make any legal decisions or sign important papers.  Meals: Start with liquid foods such as gelatin or soup. Progress to regular foods as tolerated. Avoid greasy, spicy, heavy foods. If nausea and/or vomiting occur, drink only clear liquids until the nausea and/or  vomiting subsides. Call your physician if vomiting continues.  Special Instructions/Symptoms: Your throat may feel dry or sore from the anesthesia or the breathing tube placed in your throat during surgery. If this causes discomfort, gargle with warm salt water. The discomfort should disappear within 24 hours.

## 2022-11-04 NOTE — Anesthesia Postprocedure Evaluation (Signed)
Anesthesia Post Note  Patient: Alicia Perry  Procedure(s) Performed: TREPHINATION CYST PILONIDAL EXTENSIVE     Patient location during evaluation: PACU Anesthesia Type: MAC Level of consciousness: awake and alert Pain management: pain level controlled Vital Signs Assessment: post-procedure vital signs reviewed and stable Respiratory status: spontaneous breathing, nonlabored ventilation and respiratory function stable Cardiovascular status: stable and blood pressure returned to baseline Anesthetic complications: no   No notable events documented.  Last Vitals:  Vitals:   11/04/22 0944 11/04/22 1019  BP: (!) 131/52 135/72  Pulse: (!) 57 (!) 53  Resp: 10 16  Temp: 36.7 C 36.8 C  SpO2: 97% 99%    Last Pain:  Vitals:   11/04/22 1019  TempSrc:   PainSc: 0-No pain                 Beryle Lathe

## 2022-11-05 ENCOUNTER — Encounter (HOSPITAL_BASED_OUTPATIENT_CLINIC_OR_DEPARTMENT_OTHER): Payer: Self-pay | Admitting: Surgery

## 2022-11-27 DIAGNOSIS — Z419 Encounter for procedure for purposes other than remedying health state, unspecified: Secondary | ICD-10-CM | POA: Diagnosis not present

## 2022-12-16 NOTE — Progress Notes (Signed)
Madelaine Bhat, CMA,acting as a Neurosurgeon for Arnette Felts, FNP.,have documented all relevant documentation on the behalf of Arnette Felts, FNP,as directed by  Arnette Felts, FNP while in the presence of Arnette Felts, FNP.  Subjective:    Patient ID: Alicia Perry , female    DOB: [birth year] , 42 y.o.   MRN: [id number]  Chief Complaint  Patient presents with   Annual Exam    HPI  Patient presents today for HM, Patient reports compliance with medication. Patient denies any chest pain, SOB, or headaches. Patient has no concerns today. She is not interested in nicotine patches but is trying to quit smoking.   She has not tried any medications for weight loss. She has done super cleanse. Goals for her weight loss, would like to lose about 60 lbs. She has not had challenges with her weight. After having her daughter 11 years ago she never lost the weight.      Past Medical History:  Diagnosis Date   No pertinent past medical history      Family History  Problem Relation Age of Onset   Diabetes Father    Hypertension Father    Heart failure Father    Kidney disease Father    Breast cancer Neg Hx      Current Outpatient Medications:    azelastine (ASTELIN) 0.1 % nasal spray, Place 2 sprays into both nostrils 2 (two) times daily. Use in each nostril as directed (Patient taking differently: Place 2 sprays into both nostrils as needed. Use in each nostril as directed), Disp: 30 mL, Rfl: 12   Multiple Vitamin (MULTIVITAMIN WITH MINERALS) TABS tablet, Take 1 tablet by mouth daily., Disp: , Rfl:    Semaglutide-Weight Management (WEGOVY) 0.5 MG/0.5ML SOAJ, Inject 0.5 mg into the skin once a week., Disp: 2 mL, Rfl: 0   VENTOLIN HFA 108 (90 Base) MCG/ACT inhaler, INHALE 2 PUFFS INTO THE LUNGS EVERY 6 HOURS AS NEEDED FOR WHEEZING OR SHORTNESS OF BREATH, Disp: 18 g, Rfl: 1   No Known Allergies    The patient states she uses none for birth control. Patient's last menstrual period was 12/04/2022.   Negative for Dysmenorrhea and Negative for Menorrhagia. Negative for: breast discharge, breast lump(s), breast pain and breast self exam. Associated symptoms include abnormal vaginal bleeding. Pertinent negatives include abnormal bleeding (hematology), anxiety, decreased libido, depression, difficulty falling sleep, dyspareunia, history of infertility, nocturia, sexual dysfunction, sleep disturbances, urinary incontinence, urinary urgency, vaginal discharge and vaginal itching. Diet regular; she has cut back on her soda intake. She eats fruits and vegetables. She does eat sweets when her cycle comes on. The patient states her exercise level is minimal with the vibration plate and a hoola hoop.   The patient's tobacco use is:  Social History   Tobacco Use  Smoking Status Every Day   Current packs/day: 0.75   Average packs/day: 0.8 packs/day for 22.0 years (16.5 ttl pk-yrs)   Types: Cigarettes  Smokeless Tobacco Never   She has been exposed to passive smoke. The patient's alcohol use is:  Social History   Substance and Sexual Activity  Alcohol Use Yes   Alcohol/week: 2.0 standard drinks of alcohol   Types: 2 Glasses of wine per week   Comment: socially  Additional information: Last pap unknown, next one scheduled for today.    Review of Systems  Constitutional: Negative.   HENT: Negative.    Eyes: Negative.   Respiratory: Negative.    Cardiovascular: Negative.   Gastrointestinal:  Negative.   Endocrine: Negative.   Genitourinary: Negative.   Musculoskeletal: Negative.   Skin: Negative.   Allergic/Immunologic: Negative.   Neurological: Negative.   Hematological: Negative.   Psychiatric/Behavioral: Negative.       Today's Vitals   12/17/22 1112  BP: 110/70  Pulse: 85  Temp: 98 F (36.7 C)  TempSrc: Oral  Weight: 223 lb 3.2 oz (101.2 kg)  Height: 5\' 3"  (1.6 m)  PainSc: 0-No pain   Body mass index is 39.54 kg/m.  Wt Readings from Last 3 Encounters:  12/17/22 223 lb 3.2  oz (101.2 kg)  11/04/22 221 lb 3.2 oz (100.3 kg)  03/31/22 229 lb (103.9 kg)     Objective:  Physical Exam Vitals reviewed.  Constitutional:      General: She is not in acute distress.    Appearance: Normal appearance. She is well-developed. She is obese.  HENT:     Head: Normocephalic and atraumatic.     Right Ear: Hearing, tympanic membrane, ear canal and external ear normal. There is no impacted cerumen.     Left Ear: Hearing, tympanic membrane, ear canal and external ear normal. There is no impacted cerumen.     Nose: Nose normal.     Mouth/Throat:     Lips: Pink.     Mouth: Mucous membranes are moist.  Eyes:     General: Lids are normal.     Extraocular Movements: Extraocular movements intact.     Conjunctiva/sclera: Conjunctivae normal.     Pupils: Pupils are equal, round, and reactive to light.     Funduscopic exam:    Right eye: No papilledema.        Left eye: No papilledema.  Neck:     Thyroid: No thyroid mass.     Vascular: No carotid bruit.  Cardiovascular:     Rate and Rhythm: Normal rate and regular rhythm.     Pulses: Normal pulses.     Heart sounds: Normal heart sounds. No murmur heard. Pulmonary:     Effort: Pulmonary effort is normal. No respiratory distress.     Breath sounds: Normal breath sounds. No wheezing.  Chest:     Chest wall: No mass.  Breasts:    Tanner Score is 5.     Right: Normal. No mass or tenderness.     Left: Normal. No mass or tenderness.  Abdominal:     General: Abdomen is flat. Bowel sounds are normal. There is no distension.     Palpations: Abdomen is soft.     Tenderness: There is no abdominal tenderness.  Musculoskeletal:        General: No swelling. Normal range of motion.     Cervical back: Full passive range of motion without pain, normal range of motion and neck supple.     Right lower leg: No edema.     Left lower leg: No edema.  Lymphadenopathy:     Upper Body:     Right upper body: No supraclavicular, axillary or  pectoral adenopathy.     Left upper body: No supraclavicular, axillary or pectoral adenopathy.  Skin:    General: Skin is warm and dry.     Capillary Refill: Capillary refill takes less than 2 seconds.     Comments: Skin tag present to left side of neck  Neurological:     General: No focal deficit present.     Mental Status: She is alert and oriented to person, place, and time.     Cranial  Nerves: No cranial nerve deficit.     Sensory: No sensory deficit.  Psychiatric:        Mood and Affect: Mood normal.        Behavior: Behavior normal.        Thought Content: Thought content normal.        Judgment: Judgment normal.         Assessment And Plan:     Encounter for annual health examination Assessment & Plan: Behavior modifications discussed and diet history reviewed.   Pt will continue to exercise regularly and modify diet with low GI, plant based foods and decrease intake of processed foods.  Recommend intake of daily multivitamin, Vitamin D, and calcium.  Recommend mammogram for preventive screenings, as well as recommend immunizations that include influenza, TDAP    Encounter for Papanicolaou smear of cervix -     Cytology - PAP  Tobacco abuse Assessment & Plan: Smoking cessation instruction/counseling given:  counseled patient on the dangers of tobacco use, advised patient to stop smoking, and reviewed strategies to maximize success    Screening for STDs (sexually transmitted diseases) -     NuSwab Vaginitis Plus (VG+) -     HIV Antibody (routine testing w rflx) -     HSV 1 and 2 Ab, IgG -     RPR  Influenza vaccination declined Assessment & Plan: Patient declined influenza vaccination at this time. Patient is aware that influenza vaccine prevents illness in 70% of healthy people, and reduces hospitalizations to 30-70% in elderly. This vaccine is recommended annually. Education has been provided regarding the importance of this vaccine but patient still declined.  Advised may receive this vaccine at local pharmacy or Health Dept.or vaccine clinic. Aware to provide a copy of the vaccination record if obtained from local pharmacy or Health Dept.  Pt is willing to accept risk associated with refusing vaccination.    Class 2 obesity due to excess calories without serious comorbidity with body mass index (BMI) of 39.0 to 39.9 in adult Assessment & Plan: She is encouraged to strive for BMI less than 30 to decrease cardiac risk. Advised to aim for at least 150 minutes of exercise per week.  If you have any stomach pain or difficulty swallowing call to office.  Will start [facility] pending insurance approval. Reginal Lutes may cause nausea allow time for this to improve. Goal to lose 1-2 lbs per week. Increase your physical activity and incorporate 2 days of strength training.   Orders: -     Hemoglobin A1c -     Wegovy; Inject 0.5 mg into the skin once a week.  Dispense: 2 mL; Refill: 0  Encounter for screening for lipid disorder -     Lipid panel  Other long term (current) drug therapy -     CBC with Differential/Platelet -     CMP14+EGFR   Return for 1 year physical; , 8-10 week weight check.  Patient was given opportunity to ask questions. Patient verbalized understanding of the plan and was able to repeat key elements of the plan. All questions were answered to their satisfaction.   Arnette Felts, FNP  I, Arnette Felts, FNP, have reviewed all documentation for this visit. The documentation on 12/17/22 for the exam, diagnosis, procedures, and orders are all accurate and complete.

## 2022-12-17 ENCOUNTER — Encounter: Payer: Self-pay | Admitting: Nurse Practitioner

## 2022-12-17 ENCOUNTER — Other Ambulatory Visit (HOSPITAL_COMMUNITY)
Admission: RE | Admit: 2022-12-17 | Discharge: 2022-12-17 | Disposition: A | Discharge status: Home / Self Care | Payer: Medicaid Other | Source: Ambulatory Visit | Attending: Nurse Practitioner | Admitting: Nurse Practitioner

## 2022-12-17 ENCOUNTER — Ambulatory Visit: Payer: Medicaid Other | Admitting: Nurse Practitioner

## 2022-12-17 VITALS — BP 110/70 | HR 85 | Temp 98.0°F | Ht 63.0 in | Wt 223.2 lb

## 2022-12-17 DIAGNOSIS — Z79899 Other long term (current) drug therapy: Secondary | ICD-10-CM

## 2022-12-17 DIAGNOSIS — E6609 Other obesity due to excess calories: Secondary | ICD-10-CM | POA: Diagnosis not present

## 2022-12-17 DIAGNOSIS — Z2821 Immunization not carried out because of patient refusal: Secondary | ICD-10-CM

## 2022-12-17 DIAGNOSIS — Z6839 Body mass index (BMI) 39.0-39.9, adult: Secondary | ICD-10-CM | POA: Diagnosis not present

## 2022-12-17 DIAGNOSIS — Z13220 Encounter for screening for lipoid disorders: Secondary | ICD-10-CM | POA: Diagnosis not present

## 2022-12-17 DIAGNOSIS — Z0000 Encounter for general adult medical examination without abnormal findings: Secondary | ICD-10-CM | POA: Diagnosis not present

## 2022-12-17 DIAGNOSIS — E66812 Obesity, class 2: Secondary | ICD-10-CM

## 2022-12-17 DIAGNOSIS — Z124 Encounter for screening for malignant neoplasm of cervix: Secondary | ICD-10-CM

## 2022-12-17 DIAGNOSIS — Z113 Encounter for screening for infections with a predominantly sexual mode of transmission: Secondary | ICD-10-CM

## 2022-12-17 DIAGNOSIS — Z720 Tobacco use: Secondary | ICD-10-CM

## 2022-12-17 MED ORDER — WEGOVY 0.5 MG/0.5ML ~~LOC~~ SOAJ: 0.5000 mg | SUBCUTANEOUS | 0 refills | Status: DC

## 2022-12-17 NOTE — Patient Instructions (Signed)
Benefits of Drinking Water Getting enough water every day is important for your health. Drinking water can prevent dehydration, a condition that can cause unclear thinking, result in mood change, cause your body to overheat, and lead to constipation and kidney stones. Water has no calories, so it can also help with managing body weight and reducing calorie intake when substituted for drinks with calories, such as sweet tea or regular soda.  Water helps your body: Keep a normal temperature. Lubricate and cushion joints. Protect your spinal cord and other sensitive tissues. Get rid of wastes through urination, perspiration, and bowel movements. Sporty woman drinking water after exercise Your body needs more water when you are:  In hot climates. More physically active. Running a fever. Having diarrhea or vomiting. Everyone should consume water from foods and beverages every day. Although there is no recommendation for how much plain water everyone should drink daily, there are recommendations for how much daily total water intake should come from a variety of beverages and foods.  Daily total water intake (fluid) is defined as the amount of water consumed from foods, plain drinking water, and other beverages. Daily water intake recommendations vary by age, sex, pregnancy status, and breastfeeding status.  Most of your fluid needs are met through the water and other beverages you drink. You can get some fluids through the foods that you eat--especially foods with high water content, such as many fruits and vegetables. Drinking water is one good way of getting fluids as it has zero calories.  Tips to Drink More Water Sugary drinks contribute to type 2 diabetes heart disease and obesity. Rethink your drink, Click here to learn more: [employer]cy Carry a water bottle with you and refill it throughout the day. Freeze some freezer safe water bottles. Take one with you for ice-cold water all  day long. Choose water over sugary drinks. Opt for water when eating out. You'll save money and reduce calories. Serve water during meals. Add a wedge of lime or lemon to your water. This can help improve the taste. Make sure your kids are getting enough water too. Learn more about drinking water in schools and early care and education settings  Healthier Drink Options Of course, there are many other beverage options besides water, and many of these can be part of a healthy diet.   Low- or no- calorie beverages Plain coffee or teas, sparkling water, seltzers, and flavored waters, are low-calorie choices that can be part of a healthy diet.  Drinks with calories and important nutrients Low-fat or fat-free milk; unsweetened, fortified milk alternatives; or 100% fruit or vegetable juice contain important nutrients such as calcium, potassium, or vitamin D. These drinks should be enjoyed within recommended calorie limits.  Other Beverages Sugary drinks: Regular sodas, fruit drinks, sports drinks, energy drinks, sweetened waters, and sweetened coffee and tea beverages, contain calories but little nutritional value [PDF-30.6MB]. Learn how to rethink your drink.  Alcoholic drinks: If you choose to drink alcohol, do so in moderation.  Caffeinated drinks: Moderate caffeine consumption (up to 400 mg per day) can be a part of a healthy diet.  That's up to about 3 to 5 cups of plain coffee.  Drinks with sugar alternatives: Drinks that are labeled "sugar-free" or "diet" likely contain high-intensity sweeteners, such as sucralose, aspartame, or saccharine. According to the Dietary Guidelines for Americans, "replacing added sugars with high-intensity sweeteners may reduce calorie intake in the short-term.yet questions remain about their effectiveness as a long-term weight management strategy [  PDF-30.6MB]." Learn more about high-intensity sweeteners.  Sports drinks: These are flavored beverages that often  contain carbohydrates, minerals, electrolytes, and sometimes vitamins. The average person should drink water, not sports drinks, to rehydrate.

## 2022-12-18 LAB — HSV 1 AND 2 AB, IGG
HSV 1 Glycoprotein G Ab, IgG: NONREACTIVE
HSV 2 IgG, Type Spec: REACTIVE — AB

## 2022-12-18 LAB — CBC WITH DIFFERENTIAL/PLATELET
Basophils Absolute: 0.1 10*3/uL (ref 0.0–0.2)
Basos: 1 %
EOS (ABSOLUTE): 0.3 10*3/uL (ref 0.0–0.4)
Eos: 6 %
Hematocrit: 39.4 % (ref 34.0–46.6)
Hemoglobin: 13.5 g/dL (ref 11.1–15.9)
Immature Grans (Abs): 0 10*3/uL (ref 0.0–0.1)
Immature Granulocytes: 0 %
Lymphocytes Absolute: 1.7 10*3/uL (ref 0.7–3.1)
Lymphs: 32 %
MCH: 33.2 pg — ABNORMAL HIGH (ref 26.6–33.0)
MCHC: 34.3 g/dL (ref 31.5–35.7)
MCV: 97 fL (ref 79–97)
Monocytes Absolute: 0.5 10*3/uL (ref 0.1–0.9)
Monocytes: 10 %
Neutrophils Absolute: 2.7 10*3/uL (ref 1.4–7.0)
Neutrophils: 51 %
Platelets: 382 10*3/uL (ref 150–450)
RBC: 4.07 x10E6/uL (ref 3.77–5.28)
RDW: 11.1 % — ABNORMAL LOW (ref 11.7–15.4)
WBC: 5.3 10*3/uL (ref 3.4–10.8)

## 2022-12-18 LAB — CMP14+EGFR
ALT: 11 [IU]/L (ref 0–32)
AST: 12 [IU]/L (ref 0–40)
Albumin: 3.6 g/dL — ABNORMAL LOW (ref 3.9–4.9)
Alkaline Phosphatase: 95 [IU]/L (ref 44–121)
BUN/Creatinine Ratio: 6 — ABNORMAL LOW (ref 9–23)
BUN: 5 mg/dL — ABNORMAL LOW (ref 6–24)
Bilirubin Total: 0.2 mg/dL (ref 0.0–1.2)
CO2: 21 mmol/L (ref 20–29)
Calcium: 9.4 mg/dL (ref 8.7–10.2)
Chloride: 105 mmol/L (ref 96–106)
Creatinine, Ser: 0.79 mg/dL (ref 0.57–1.00)
Globulin, Total: 3.1 g/dL (ref 1.5–4.5)
Glucose: 88 mg/dL (ref 70–99)
Potassium: 4.5 mmol/L (ref 3.5–5.2)
Sodium: 137 mmol/L (ref 134–144)
Total Protein: 6.7 g/dL (ref 6.0–8.5)
eGFR: 96 mL/min/{1.73_m2} (ref >59)

## 2022-12-18 LAB — LIPID PANEL
Chol/HDL Ratio: 2.8 ratio (ref 0.0–4.4)
Cholesterol, Total: 128 mg/dL (ref 100–199)
HDL: 46 mg/dL (ref >39)
LDL Chol Calc (NIH): 67 mg/dL (ref 0–99)
Triglycerides: 72 mg/dL (ref 0–149)
VLDL Cholesterol Cal: 15 mg/dL (ref 5–40)

## 2022-12-18 LAB — HEMOGLOBIN A1C
Est. average glucose Bld gHb Est-mCnc: 105 mg/dL
Hgb A1c MFr Bld: 5.3 % (ref 4.8–5.6)

## 2022-12-18 LAB — RPR: RPR Ser Ql: NONREACTIVE

## 2022-12-18 LAB — HIV ANTIBODY (ROUTINE TESTING W REFLEX): HIV Screen 4th Generation wRfx: NONREACTIVE

## 2022-12-20 LAB — NUSWAB VAGINITIS PLUS (VG+)
Candida albicans, NAA: NEGATIVE
Candida glabrata, NAA: NEGATIVE
Chlamydia trachomatis, NAA: NEGATIVE
Neisseria gonorrhoeae, NAA: NEGATIVE
Trich vag by NAA: NEGATIVE

## 2022-12-21 LAB — CYTOLOGY - PAP
Adequacy: ABSENT
Diagnosis: NEGATIVE

## 2022-12-23 DIAGNOSIS — E6609 Other obesity due to excess calories: Secondary | ICD-10-CM | POA: Insufficient documentation

## 2022-12-23 DIAGNOSIS — Z2821 Immunization not carried out because of patient refusal: Secondary | ICD-10-CM | POA: Insufficient documentation

## 2022-12-23 DIAGNOSIS — Z124 Encounter for screening for malignant neoplasm of cervix: Secondary | ICD-10-CM | POA: Insufficient documentation

## 2022-12-23 DIAGNOSIS — Z0000 Encounter for general adult medical examination without abnormal findings: Secondary | ICD-10-CM | POA: Insufficient documentation

## 2022-12-23 DIAGNOSIS — Z720 Tobacco use: Secondary | ICD-10-CM | POA: Insufficient documentation

## 2022-12-23 NOTE — Assessment & Plan Note (Signed)
Smoking cessation instruction/counseling given:  counseled patient on the dangers of tobacco use, advised patient to stop smoking, and reviewed strategies to maximize success

## 2022-12-23 NOTE — Assessment & Plan Note (Signed)

## 2022-12-23 NOTE — Assessment & Plan Note (Signed)
Behavior modifications discussed and diet history reviewed.   Pt will continue to exercise regularly and modify diet with low GI, plant based foods and decrease intake of processed foods.  Recommend intake of daily multivitamin, Vitamin D, and calcium.  Recommend mammogram for preventive screenings, as well as recommend immunizations that include influenza, TDAP

## 2022-12-23 NOTE — Assessment & Plan Note (Signed)
She is encouraged to strive for BMI less than 30 to decrease cardiac risk. Advised to aim for at least 150 minutes of exercise per week.  If you have any stomach pain or difficulty swallowing call to office.  Will start [facility] pending insurance approval. Reginal Lutes may cause nausea allow time for this to improve. Goal to lose 1-2 lbs per week. Increase your physical activity and incorporate 2 days of strength training.

## 2022-12-27 DIAGNOSIS — Z419 Encounter for procedure for purposes other than remedying health state, unspecified: Secondary | ICD-10-CM | POA: Diagnosis not present

## 2023-01-27 DIAGNOSIS — Z419 Encounter for procedure for purposes other than remedying health state, unspecified: Secondary | ICD-10-CM | POA: Diagnosis not present

## 2023-02-11 ENCOUNTER — Ambulatory Visit: Payer: Medicaid Other | Admitting: Nurse Practitioner

## 2023-02-27 DIAGNOSIS — Z419 Encounter for procedure for purposes other than remedying health state, unspecified: Secondary | ICD-10-CM | POA: Diagnosis not present

## 2023-02-28 ENCOUNTER — Encounter: Payer: Self-pay | Admitting: Nurse Practitioner

## 2023-03-01 ENCOUNTER — Other Ambulatory Visit: Payer: Self-pay

## 2023-03-01 MED ORDER — ALBUTEROL SULFATE HFA 108 (90 BASE) MCG/ACT IN AERS: 2.0000 | INHALATION_SPRAY | Freq: Four times a day (QID) | RESPIRATORY_TRACT | 1 refills | Status: AC | PRN

## 2023-03-27 DIAGNOSIS — Z419 Encounter for procedure for purposes other than remedying health state, unspecified: Secondary | ICD-10-CM | POA: Diagnosis not present

## 2023-05-08 DIAGNOSIS — Z419 Encounter for procedure for purposes other than remedying health state, unspecified: Secondary | ICD-10-CM | POA: Diagnosis not present

## 2023-06-07 DIAGNOSIS — Z419 Encounter for procedure for purposes other than remedying health state, unspecified: Secondary | ICD-10-CM | POA: Diagnosis not present

## 2023-07-08 DIAGNOSIS — Z419 Encounter for procedure for purposes other than remedying health state, unspecified: Secondary | ICD-10-CM | POA: Diagnosis not present

## 2023-08-07 DIAGNOSIS — Z419 Encounter for procedure for purposes other than remedying health state, unspecified: Secondary | ICD-10-CM | POA: Diagnosis not present

## 2023-09-07 DIAGNOSIS — Z419 Encounter for procedure for purposes other than remedying health state, unspecified: Secondary | ICD-10-CM | POA: Diagnosis not present

## 2023-09-14 ENCOUNTER — Other Ambulatory Visit: Payer: Self-pay

## 2023-09-14 DIAGNOSIS — J3489 Other specified disorders of nose and nasal sinuses: Secondary | ICD-10-CM

## 2023-09-14 MED ORDER — AZELASTINE HCL 0.1 % NA SOLN: NASAL | 1 refills | Status: AC

## 2023-09-20 DIAGNOSIS — Z0120 Encounter for dental examination and cleaning without abnormal findings: Secondary | ICD-10-CM | POA: Diagnosis not present

## 2023-09-28 ENCOUNTER — Other Ambulatory Visit: Payer: Self-pay | Admitting: Nurse Practitioner

## 2023-09-28 DIAGNOSIS — Z1231 Encounter for screening mammogram for malignant neoplasm of breast: Secondary | ICD-10-CM

## 2023-09-29 ENCOUNTER — Encounter

## 2023-09-29 DIAGNOSIS — Z1231 Encounter for screening mammogram for malignant neoplasm of breast: Secondary | ICD-10-CM

## 2023-10-05 ENCOUNTER — Ambulatory Visit
Admission: RE | Admit: 2023-10-05 | Discharge: 2023-10-05 | Disposition: A | Discharge status: Home / Self Care | Source: Ambulatory Visit | Attending: Nurse Practitioner | Admitting: Nurse Practitioner

## 2023-10-05 DIAGNOSIS — Z1231 Encounter for screening mammogram for malignant neoplasm of breast: Secondary | ICD-10-CM

## 2023-10-06 ENCOUNTER — Other Ambulatory Visit: Payer: Self-pay | Admitting: Nurse Practitioner

## 2023-10-06 MED ORDER — FLUCONAZOLE 100 MG PO TABS: 100.0000 mg | ORAL_TABLET | Freq: Every day | ORAL | 0 refills | Status: DC

## 2023-10-08 DIAGNOSIS — Z419 Encounter for procedure for purposes other than remedying health state, unspecified: Secondary | ICD-10-CM | POA: Diagnosis not present

## 2023-10-12 ENCOUNTER — Ambulatory Visit
Admission: RE | Admit: 2023-10-12 | Discharge: 2023-10-12 | Disposition: A | Discharge status: Home / Self Care | Source: Ambulatory Visit | Attending: Nurse Practitioner | Admitting: Nurse Practitioner

## 2023-10-12 DIAGNOSIS — Z1231 Encounter for screening mammogram for malignant neoplasm of breast: Secondary | ICD-10-CM | POA: Diagnosis not present

## 2023-11-07 DIAGNOSIS — Z419 Encounter for procedure for purposes other than remedying health state, unspecified: Secondary | ICD-10-CM | POA: Diagnosis not present

## 2023-11-09 DIAGNOSIS — Z0120 Encounter for dental examination and cleaning without abnormal findings: Secondary | ICD-10-CM | POA: Diagnosis not present

## 2023-12-09 ENCOUNTER — Other Ambulatory Visit: Payer: Self-pay

## 2023-12-09 ENCOUNTER — Ambulatory Visit: Admitting: Obstetrics and Gynecology

## 2023-12-09 ENCOUNTER — Encounter: Payer: Self-pay | Admitting: Obstetrics and Gynecology

## 2023-12-09 VITALS — BP 164/87 | HR 93 | Wt 211.0 lb

## 2023-12-09 DIAGNOSIS — A630 Anogenital (venereal) warts: Secondary | ICD-10-CM

## 2023-12-09 NOTE — Progress Notes (Signed)
 GYNECOLOGY VISIT  Patient name: Alicia Perry MRN [id number]  Date of birth: 1980/11/05 Chief Complaint:   Gynecologic Exam  History:  Alicia Perry here for genital wart treatment. Felt TCA may have helped a little but requesting treatment at this time.     The following portions of the patient's history were reviewed and updated as appropriate: allergies, current medications, past family history, past medical history, past social history, past surgical history and problem list.   Health Maintenance:   Last pap     Component Value Date/Time   DIAGPAP  12/17/2022 1136    - Negative for intraepithelial lesion or malignancy (NILM)   DIAGPAP  12/05/2019 0915    - Negative for intraepithelial lesion or malignancy (NILM)   ADEQPAP  12/17/2022 1136    Satisfactory for evaluation; transformation zone component ABSENT.   ADEQPAP  12/05/2019 0915    Satisfactory for evaluation; transformation zone component PRESENT.    Health Maintenance  Topic Date Due   Pneumococcal Vaccine (1 of 2 - PCV) Never done   Hepatitis B Vaccine (1 of 3 - 19+ 3-dose series) Never done   HPV Vaccine (1 - Risk 3-dose SCDM series) Never done   Flu Shot  Never done   Breast Cancer Screening  10/11/2025   Pap with HPV screening  12/16/2025   DTaP/Tdap/Td vaccine (3 - Td or Tdap) 12/11/2031   Hepatitis C Screening  Completed   HIV Screening  Completed   Meningitis B Vaccine  Aged Out   COVID-19 Vaccine  Discontinued      Review of Systems:  Pertinent items are noted in HPI. Comprehensive review of systems was otherwise negative.   Objective:  Physical Exam BP (!) 164/87   Pulse 93   Wt 211 lb (95.7 kg)   BMI 37.38 kg/m    Physical Exam Vitals and nursing note reviewed. Exam conducted with a chaperone present.  Constitutional:      Appearance: Normal appearance.  HENT:     Head: Normocephalic and atraumatic.  Pulmonary:     Effort: Pulmonary effort is normal.     Breath sounds:  Normal breath sounds.  Genitourinary:    General: Normal vulva.     Exam position: Lithotomy position.     Vagina: Normal.     Cervix: Normal.      Comments: 2-63mm  Skin:    General: Skin is warm and dry.  Neurological:     General: No focal deficit present.     Mental Status: She is alert.  Psychiatric:        Mood and Affect: Mood normal.        Behavior: Behavior normal.        Thought Content: Thought content normal.        Judgment: Judgment normal.    Genital Wart Treatment- TCA Patient identified, informed consent signed and copy in chart, time out performed.    Areas of typical appearing genital warts noted x3  - superior to clitoris, right labia majora and perineum near anal verge.  Surrounding area coated with water based lubricant and TCA applied until warts had white appearance.   Patient tolerated the procedure well.         Assessment & Plan:  1. Genital warts (Primary) Post procedure instructions given and patient told to wash area in thirty minutes.  Return in 1 week for next treatment.    Carter Quarry, MD Minimally Invasive Gynecologic Surgery [facility]  Healthcare, [facility]

## 2023-12-13 ENCOUNTER — Encounter: Admitting: Obstetrics and Gynecology

## 2023-12-15 NOTE — Progress Notes (Signed)
 [facility] 12/15/23  Dental procedures in this visit  . I0000 - FULL MOUTH PROBE (Completed)    Service provider: Zane Berber, [facility]    Billing provider: Gershon Louder, DMD   HEALTH HISTORY ? Vitals:  BP Readings from Last 1 Encounters:  12/15/23 (!) 137/90  Pulse:  78  Medical history was reviewed and updated. No contraindication to care.  Medical History[1] Surgical History[2] Social History[3] Family History[4] Medications Ordered Prior to Encounter[5]  Medical Risk Assessment ASA GRADE: ASA 1 - Normal health patient  Subjective: Patient reports no change to condition since last visit.   Objective:  Radiographs taken: No radiographs captured at this appointment.    Periodontal Evaluation Periodontal Charting: Updated full mouth periodontal charting and documented all findings in patient's tooth chart: Yes Radiographic studies show moderate generalized horizontal bone loss  Periodontal findings:  Tissue color: Red, consistency: Generalized rolled and erthymatous margins Bleeding: More than 30% of sites, generalized Patient's oral hygiene is n/a (first visit) with Brittney Patient came in for six week fine scale after 4346, re-probed and determined she needed a SRP and not just a prophy. Sent off prior approval to insurance for cleaning.    Assessment: Informed patient of all periodontal findings. Informed patient of recommended treatment procedure(s) based on findings. Patient confirmed they understood and has no additional questions. .  Additional Notes: Patient came in for six week fine scale after 4346, re-probed and determined she needed a SRP and not just a prophy. Sent off prior approval to insurance for cleaning.  Recommended recall interval: will call patient when insurance approves or denies SRP .  Next Visit: Hygiene appointment: to be determined-will call patient  Treatment Providers Gershon Louder, DMD Zane Berber,  [facility] HEALTH - [facility] DENTAL [address] [place] [id number] [phone]       [1] History reviewed. No pertinent past medical history. [2] Past Surgical History: Procedure Laterality Date  . CYST REMOVAL  2024   from butt  [3]   [4] No family history on file. [5] Current Outpatient Medications on File Prior to Visit  Medication Sig Dispense Refill  . amoxicillin  (AMOXIL ) 500 mg tablet TAKE 1 TABLET BY MOUTH EVERY 8 HOURS UNTIL ALL TAKEN    . azelastine  (ASTELIN ) 137 mcg (0.1 %) nasal spray Use in each nostril as directed    . chlorhexidine  (PERIDEX ) 0.12 % solution     . fluconazole  (DIFLUCAN ) 100 mg tablet Take 100 mg by mouth.    . ibuprofen  (ADVIL ,MOTRIN ) 800 mg tablet Take 1 tablet by mouth 3 (three) times a day if needed for pain.    . semaglutide , weight loss, (Wegovy ) 0.5 mg/0.5 mL pen injector Inject 0.5 mg under the skin.    . Ventolin  HFA 90 mcg/actuation inhaler inhale 2 puffs into the lungs every 6 hours as needed for wheezing or shortness of breath     No current facility-administered medications on file prior to visit.

## 2023-12-16 ENCOUNTER — Ambulatory Visit: Admitting: Obstetrics and Gynecology

## 2023-12-16 VITALS — BP 145/83 | HR 77 | Wt 211.3 lb

## 2023-12-16 DIAGNOSIS — A630 Anogenital (venereal) warts: Secondary | ICD-10-CM

## 2023-12-16 NOTE — Progress Notes (Signed)
    GYNECOLOGY VISIT  Patient name: Alicia Perry MRN [id number]  Date of birth: 03/11/1980 Chief Complaint:   Genital Warts  History:  Alicia Perry presents for TCA treatment of genital warts. Did well after last treatment.    The following portions of the patient's history were reviewed and updated as appropriate: allergies, current medications, past family history, past medical history, past social history, past surgical history and problem list.   Health Maintenance:   Last pap     Component Value Date/Time   DIAGPAP  12/17/2022 1136    - Negative for intraepithelial lesion or malignancy (NILM)   DIAGPAP  12/05/2019 0915    - Negative for intraepithelial lesion or malignancy (NILM)   ADEQPAP  12/17/2022 1136    Satisfactory for evaluation; transformation zone component ABSENT.   ADEQPAP  12/05/2019 0915    Satisfactory for evaluation; transformation zone component PRESENT.    Health Maintenance  Topic Date Due   Pneumococcal Vaccine (1 of 2 - PCV) Never done   Hepatitis B Vaccine (1 of 3 - 19+ 3-dose series) Never done   HPV Vaccine (1 - Risk 3-dose SCDM series) Never done   Flu Shot  Never done   Breast Cancer Screening  10/11/2025   Pap with HPV screening  12/16/2025   DTaP/Tdap/Td vaccine (3 - Td or Tdap) 12/11/2031   Hepatitis C Screening  Completed   HIV Screening  Completed   Meningitis B Vaccine  Aged Out   COVID-19 Vaccine  Discontinued      Review of Systems:  Pertinent items are noted in HPI. Comprehensive review of systems was otherwise negative.   Objective:  Physical Exam BP (!) 145/83   Pulse 77   Wt 211 lb 4.8 oz (95.8 kg)   BMI 37.43 kg/m    Physical Exam  Genital Wart Treatment with TCA  Patient identified, informed consent signed and copy in chart, time out performed.   Areas of typical appearing genital warts noted on labia majora superior to clitoris, upper labia majora and perianal skin.  Surrounding area coated with water  based lubricant and TCA applied until warts had white appearance.   Patient tolerated the procedure well.        Assessment & Plan:   1. Genital warts (Primary)  Post procedure instructions given and patient told to wash area in thirty minutes. Now s/p 2 of 5 treatments. Return in 1 week for next treatment     Emalene Welte, MD Minimally Invasive Gynecologic Surgery Center for [facility], [facility]

## 2023-12-21 ENCOUNTER — Encounter: Payer: Self-pay | Admitting: Nurse Practitioner

## 2023-12-21 ENCOUNTER — Ambulatory Visit: Payer: Self-pay | Admitting: Nurse Practitioner

## 2023-12-21 VITALS — BP 146/90 | HR 80 | Temp 98.1°F | Ht 63.0 in | Wt 215.0 lb

## 2023-12-21 DIAGNOSIS — Z720 Tobacco use: Secondary | ICD-10-CM | POA: Diagnosis not present

## 2023-12-21 DIAGNOSIS — Z0000 Encounter for general adult medical examination without abnormal findings: Secondary | ICD-10-CM

## 2023-12-21 DIAGNOSIS — J0120 Acute ethmoidal sinusitis, unspecified: Secondary | ICD-10-CM

## 2023-12-21 DIAGNOSIS — Z136 Encounter for screening for cardiovascular disorders: Secondary | ICD-10-CM

## 2023-12-21 DIAGNOSIS — E66812 Obesity, class 2: Secondary | ICD-10-CM

## 2023-12-21 DIAGNOSIS — H9203 Otalgia, bilateral: Secondary | ICD-10-CM

## 2023-12-21 DIAGNOSIS — I10 Essential (primary) hypertension: Secondary | ICD-10-CM | POA: Diagnosis not present

## 2023-12-21 DIAGNOSIS — Z6837 Body mass index (BMI) 37.0-37.9, adult: Secondary | ICD-10-CM

## 2023-12-21 DIAGNOSIS — Z13220 Encounter for screening for lipoid disorders: Secondary | ICD-10-CM | POA: Diagnosis not present

## 2023-12-21 MED ORDER — AMOXICILLIN-POT CLAVULANATE 875-125 MG PO TABS: 1.0000 | ORAL_TABLET | Freq: Two times a day (BID) | ORAL | 0 refills | Status: AC

## 2023-12-21 MED ORDER — AMLODIPINE BESYLATE 2.5 MG PO TABS: 2.5000 mg | ORAL_TABLET | Freq: Every day | ORAL | 2 refills | Status: AC

## 2023-12-21 NOTE — Patient Instructions (Signed)

## 2023-12-21 NOTE — Progress Notes (Signed)
 Alicia Perry, CMA,acting as a neurosurgeon for [employer], FNP.,have documented all relevant documentation on the behalf of Alicia Ada, FNP,as directed by  Alicia Ada, FNP while in the presence of Alicia Ada, FNP.  Subjective:    Patient ID: Alicia Perry , female    DOB: [birth year] , 43 y.o.   MRN: [id number]  Chief Complaint  Patient presents with   Annual Exam    Patient presents today for HM, Patient reports compliance with medication. Patient denies any chest pain, SOB, or headaches. Patient reports she may have a sinus infection her ears are hurting and when she blows her nose her mucus Is yellow.    She has been to [facility] - she was treated for HPV treatment. She is followed at the [facility] on [address] She had her wisdom teeth pulled  She also had her routine dental cleaning since her last visit.     Discussed the use of AI scribe software for clinical note transcription with the patient, who gave verbal consent to proceed.  History of Present Illness Alicia Perry is a 43 year old female who presents for an annual physical exam and evaluation of hypertension.  She experiences sinus symptoms characterized by nasal stuffiness and uses Flonase  nasal spray for occasional relief. She also uses Zestilene nasal spray, which she finds unpleasant. She reports left ear pain since her wisdom teeth extraction in October.  Her blood pressure has been elevated on multiple occasions, with recent readings of 150/90 today, 145/83 on November 20, 164/87 on November 13, and previous readings in 2023 of 138/89 and 143/73. She notes normal blood pressure during recent dental visits. There is no swelling in her feet or ankles. She has a family history of hypertension, with her father affected.  She smokes approximately ten cigarettes a day and is attempting to quit. She has reduced her soda intake and does not snack or consume sweets as much. She works at [employer], which involves physical activity, but she does not engage in regular exercise. She drinks caffeinated coffee daily. No feelings of depression.  Past Medical History:  Diagnosis Date   No pertinent past medical history      Family History  Problem Relation Age of Onset   Diabetes Father    Hypertension Father    Heart failure Father    Kidney disease Father    Breast cancer Neg Hx      Current Outpatient Medications:    albuterol  (VENTOLIN  HFA) 108 (90 Base) MCG/ACT inhaler, Inhale 2 puffs into the lungs every 6 (six) hours as needed for wheezing or shortness of breath., Disp: 18 g, Rfl: 1   amLODipine  (NORVASC ) 2.5 MG tablet, Take 1 tablet (2.5 mg total) by mouth daily. (Patient not taking: Reported on 12/30/2023), Disp: 30 tablet, Rfl: 2   amoxicillin -clavulanate (AUGMENTIN ) 875-125 MG tablet, Take 1 tablet by mouth 2 (two) times daily. (Patient not taking: Reported on 12/30/2023), Disp: 14 tablet, Rfl: 0   azelastine  (ASTELIN ) 0.1 % nasal spray, Use in each nostril as directed, Disp: 30 mL, Rfl: 1   Multiple Vitamin (MULTIVITAMIN WITH MINERALS) TABS tablet, Take 1 tablet by mouth daily., Disp: , Rfl:    No Known Allergies    The patient states she uses none for birth control. Patient's last menstrual period was 12/04/2023.. Negative for Dysmenorrhea and Negative for Menorrhagia. Negative for: breast discharge, breast lump(s), breast pain and breast self exam. Associated symptoms include abnormal vaginal bleeding. Pertinent  negatives include abnormal bleeding (hematology), anxiety, decreased libido, depression, difficulty falling sleep, dyspareunia, history of infertility, nocturia, sexual dysfunction, sleep disturbances, urinary incontinence, urinary urgency, vaginal discharge and vaginal itching. Diet regular; she has cut back on her snacks and soda intake. The patient states her exercise level is minimal - her job is physical. She works at [employer].    The patient's tobacco use is:  Social History   Tobacco Use  Smoking Status Every Day   Current packs/day: 0.75   Average packs/day: 0.8 packs/day for 22.0 years (16.5 ttl pk-yrs)   Types: Cigarettes  Smokeless Tobacco Never  Tobacco Comments   12-21-23 - smoking 10 cigs/day, trying to quit  She has been exposed to passive smoke. The patient's alcohol use is:  Social History   Substance and Sexual Activity  Alcohol Use Yes   Alcohol/week: 2.0 standard drinks of alcohol   Types: 2 Glasses of wine per week   Comment: socially  Additional information: Last pap 12/17/2022, next one scheduled for 12/16/2025.    Review of Systems  Constitutional: Negative.   HENT: Negative.    Eyes: Negative.   Respiratory: Negative.    Cardiovascular: Negative.   Gastrointestinal: Negative.   Endocrine: Negative.   Genitourinary: Negative.   Musculoskeletal: Negative.   Skin: Negative.   Allergic/Immunologic: Negative.   Neurological: Negative.   Hematological: Negative.   Psychiatric/Behavioral: Negative.       Today's Vitals   12/21/23 0922 12/21/23 1007  BP: (!) 150/90 (!) 146/90  Pulse: 80   Temp: 98.1 F (36.7 C)   TempSrc: Oral   Weight: 215 lb (97.5 kg)   Height: 5' 3 (1.6 m)   PainSc: 0-No pain    Body mass index is 38.09 kg/m.  Wt Readings from Last 3 Encounters:  12/30/23 210 lb 11.2 oz (95.6 kg)  12/21/23 215 lb (97.5 kg)  12/16/23 211 lb 4.8 oz (95.8 kg)     Objective:  Physical Exam Vitals and nursing note reviewed.  Constitutional:      General: She is not in acute distress.    Appearance: Normal appearance. She is well-developed. She is obese.  HENT:     Head: Normocephalic and atraumatic.     Right Ear: Hearing, tympanic membrane, ear canal and external ear normal. There is no impacted cerumen.     Left Ear: Hearing, tympanic membrane, ear canal and external ear normal. There is no impacted cerumen.     Nose: Nose normal.     Mouth/Throat:      Mouth: Mucous membranes are moist.  Eyes:     General: Lids are normal.     Extraocular Movements: Extraocular movements intact.     Conjunctiva/sclera: Conjunctivae normal.     Pupils: Pupils are equal, round, and reactive to light.     Funduscopic exam:    Right eye: No papilledema.        Left eye: No papilledema.  Neck:     Thyroid: No thyroid mass.     Vascular: No carotid bruit.  Cardiovascular:     Rate and Rhythm: Normal rate and regular rhythm.     Pulses: Normal pulses.     Heart sounds: Normal heart sounds. No murmur heard. Pulmonary:     Effort: Pulmonary effort is normal. No respiratory distress.     Breath sounds: Normal breath sounds. No wheezing.  Chest:     Chest wall: No mass.  Breasts:    Tanner Score is 5.  Right: Normal. No mass or tenderness.     Left: Normal. No mass or tenderness.  Abdominal:     General: Abdomen is flat. Bowel sounds are normal. There is no distension.     Palpations: Abdomen is soft.     Tenderness: There is no abdominal tenderness.  Genitourinary:    Rectum: Guaiac result negative.  Musculoskeletal:        General: No swelling. Normal range of motion.     Cervical back: Full passive range of motion without pain, normal range of motion and neck supple.     Right lower leg: No edema.     Left lower leg: No edema.  Lymphadenopathy:     Upper Body:     Right upper body: No supraclavicular, axillary or pectoral adenopathy.     Left upper body: No supraclavicular, axillary or pectoral adenopathy.  Skin:    General: Skin is warm and dry.     Capillary Refill: Capillary refill takes less than 2 seconds.  Neurological:     General: No focal deficit present.     Mental Status: She is alert and oriented to person, place, and time.     Cranial Nerves: No cranial nerve deficit.     Sensory: No sensory deficit.  Psychiatric:        Mood and Affect: Mood normal.        Behavior: Behavior normal.        Thought Content: Thought content  normal.        Judgment: Judgment normal.         Assessment And Plan:     Encounter for annual health examination Assessment & Plan: Routine adult wellness visit with discussion on lifestyle modifications. - Encouraged monthly self-breast exams.  Orders: -     CBC -     CMP14+EGFR  Otalgia of both ears Assessment & Plan: Since wisdom teeth extraction. Left ear canal slightly red. No significant pain reported.  Orders: -     Amoxicillin -Pot Clavulanate; Take 1 tablet by mouth 2 (two) times daily. (Patient not taking: Reported on 12/30/2023)  Dispense: 14 tablet; Refill: 0  Essential hypertension Assessment & Plan: Hypertension with readings above 130/80 mmHg. Discussed risks of uncontrolled hypertension and potential side effects of amlodipine . Emphasized lifestyle modifications. - Started amlodipine  2.5 mg daily. - Ordered baseline labs. - Scheduled nurse visit for blood pressure check in 2 weeks. - Scheduled follow-up appointment in 8-10 weeks to assess medication efficacy and side effects. - Encouraged lifestyle modifications including reducing salt intake, increasing water consumption, and regular exercise.  Orders: -     Microalbumin / creatinine urine ratio -     amLODIPine  Besylate; Take 1 tablet (2.5 mg total) by mouth daily. (Patient not taking: Reported on 12/30/2023)  Dispense: 30 tablet; Refill: 2 -     EKG 12-Lead  Acute non-recurrent ethmoidal sinusitis Assessment & Plan: Symptoms of sinus congestion and stuffiness. No significant pain reported. - Prescribed Augmentin . - Continue using Flonase  and Azelastine  nasal sprays.   Encounter for lipid screening for cardiovascular disease -     Lipid panel  Class 2 severe obesity due to excess calories with serious comorbidity and body mass index (BMI) of 37.0 to 37.9 in adult Assessment & Plan: Discussed dietary habits and physical activity. - Encouraged dietary modifications and increased physical activity. -  Discussed potential benefits of weight loss on blood pressure and overall health.   Tobacco abuse Assessment & Plan: Continues to smoke approximately 10  cigarettes per day. Discussed impact on blood pressure and health. - Encouraged smoking cessation.     Return for 2 week NV BP check; 8 week BP check; 1 year physical. Patient was given opportunity to ask questions. Patient verbalized understanding of the plan and was able to repeat key elements of the plan. All questions were answered to their satisfaction.   Alicia Ada, FNP  I, Alicia Ada, FNP, have reviewed all documentation for this visit. The documentation on 12/21/23 for the exam, diagnosis, procedures, and orders are all accurate and complete.

## 2023-12-22 ENCOUNTER — Ambulatory Visit: Admitting: Family Medicine

## 2023-12-22 LAB — CMP14+EGFR
ALT: 15 IU/L (ref 0–32)
AST: 10 IU/L (ref 0–40)
Albumin: 3.9 g/dL (ref 3.9–4.9)
Alkaline Phosphatase: 96 IU/L (ref 41–116)
BUN/Creatinine Ratio: 8 — ABNORMAL LOW (ref 9–23)
BUN: 6 mg/dL (ref 6–24)
Bilirubin Total: 0.4 mg/dL (ref 0.0–1.2)
CO2: 20 mmol/L (ref 20–29)
Calcium: 9.5 mg/dL (ref 8.7–10.2)
Chloride: 106 mmol/L (ref 96–106)
Creatinine, Ser: 0.8 mg/dL (ref 0.57–1.00)
Globulin, Total: 3 g/dL (ref 1.5–4.5)
Glucose: 92 mg/dL (ref 70–99)
Potassium: 4.5 mmol/L (ref 3.5–5.2)
Sodium: 139 mmol/L (ref 134–144)
Total Protein: 6.9 g/dL (ref 6.0–8.5)
eGFR: 94 mL/min/1.73 (ref >59)

## 2023-12-22 LAB — MICROALBUMIN / CREATININE URINE RATIO
Creatinine, Urine: 181.2 mg/dL
Microalb/Creat Ratio: 7 mg/g{creat} (ref 0–29)
Microalbumin, Urine: 12.8 ug/mL

## 2023-12-22 LAB — LIPID PANEL
Chol/HDL Ratio: 2.7 ratio (ref 0.0–4.4)
Cholesterol, Total: 143 mg/dL (ref 100–199)
HDL: 53 mg/dL (ref >39)
LDL Chol Calc (NIH): 75 mg/dL (ref 0–99)
Triglycerides: 80 mg/dL (ref 0–149)
VLDL Cholesterol Cal: 15 mg/dL (ref 5–40)

## 2023-12-22 LAB — CBC
Hematocrit: 41.5 % (ref 34.0–46.6)
Hemoglobin: 14.1 g/dL (ref 11.1–15.9)
MCH: 33.9 pg — ABNORMAL HIGH (ref 26.6–33.0)
MCHC: 34 g/dL (ref 31.5–35.7)
MCV: 100 fL — ABNORMAL HIGH (ref 79–97)
Platelets: 402 x10E3/uL (ref 150–450)
RBC: 4.16 x10E6/uL (ref 3.77–5.28)
RDW: 11.1 % — ABNORMAL LOW (ref 11.7–15.4)
WBC: 7.7 x10E3/uL (ref 3.4–10.8)

## 2023-12-28 ENCOUNTER — Ambulatory Visit: Payer: Self-pay | Admitting: Nurse Practitioner

## 2023-12-30 ENCOUNTER — Other Ambulatory Visit: Payer: Self-pay

## 2023-12-30 ENCOUNTER — Encounter: Payer: Self-pay | Admitting: Obstetrics and Gynecology

## 2023-12-30 ENCOUNTER — Ambulatory Visit: Admitting: Obstetrics and Gynecology

## 2023-12-30 VITALS — BP 142/73 | HR 89 | Wt 210.7 lb

## 2023-12-30 DIAGNOSIS — A630 Anogenital (venereal) warts: Secondary | ICD-10-CM

## 2023-12-30 NOTE — Progress Notes (Signed)
   GYNECOLOGY OFFICE NOTE  History:  43 y.o. G2P1011 here today for TCA treatment for warts. She feels like treatment is helping.    Past Medical History:  Diagnosis Date   No pertinent past medical history     Past Surgical History:  Procedure Laterality Date   NO PAST SURGERIES     PILONIDAL CYST EXCISION N/A 11/04/2022   Procedure: TREPHINATION CYST PILONIDAL EXTENSIVE;  Surgeon: Signe Mitzie LABOR, MD;  Location: [facility];  Service: General;  Laterality: N/A;     Current Outpatient Medications:    albuterol  (VENTOLIN  HFA) 108 (90 Base) MCG/ACT inhaler, Inhale 2 puffs into the lungs every 6 (six) hours as needed for wheezing or shortness of breath., Disp: 18 g, Rfl: 1   azelastine  (ASTELIN ) 0.1 % nasal spray, Use in each nostril as directed, Disp: 30 mL, Rfl: 1   Multiple Vitamin (MULTIVITAMIN WITH MINERALS) TABS tablet, Take 1 tablet by mouth daily., Disp: , Rfl:    amLODipine  (NORVASC ) 2.5 MG tablet, Take 1 tablet (2.5 mg total) by mouth daily. (Patient not taking: Reported on 12/30/2023), Disp: 30 tablet, Rfl: 2   amoxicillin -clavulanate (AUGMENTIN ) 875-125 MG tablet, Take 1 tablet by mouth 2 (two) times daily. (Patient not taking: Reported on 12/30/2023), Disp: 14 tablet, Rfl: 0  The following portions of the patient's history were reviewed and updated as appropriate: allergies, current medications, past family history, past medical history, past social history, past surgical history and problem list.   Review of Systems:  Pertinent items noted in HPI and remainder of comprehensive ROS otherwise negative.   Objective:  Physical Exam BP (!) 142/73   Pulse 89   Wt 210 lb 11.2 oz (95.6 kg)   LMP 12/04/2023   BMI 37.32 kg/m  CONSTITUTIONAL: Well-developed, well-nourished female in no acute distress.  HENT:  Normocephalic, atraumatic. External right and left ear normal. Oropharynx is clear and moist EYES: Conjunctivae and EOM are normal. Pupils are equal,  round, and reactive to light. No scleral icterus.  NECK: Normal range of motion, supple, no masses SKIN: Skin is warm and dry. No rash noted. Not diaphoretic. No erythema. No pallor. NEUROLOGIC: Alert and oriented to person, place, and time. Normal reflexes, muscle tone coordination. No cranial nerve deficit noted. PSYCHIATRIC: Normal mood and affect. Normal behavior. Normal judgment and thought content. CARDIOVASCULAR: Normal heart rate noted RESPIRATORY: Effort normal, no problems with respiration noted ABDOMEN: Soft, no distention noted.   PELVIC: Normal appearing external genitalia; Areas of typical appearing genital warts noted on labia majora superior to clitoris, upper labia majora and perianal skin.  MUSCULOSKELETAL: Normal range of motion. No edema noted.  Exam done with chaperone present.  Timeout performed, skin around warts coated with water based lubricant and TCA applied to warts until white and TCA had dried  Labs and Imaging No results found.  Assessment & Plan:   1. Genital warts (Primary) TCA txt performed Reviewed post procedure instructions Return 1 week for follow up txt  Routine preventative health maintenance measures emphasized. Please refer to After Visit Summary for other counseling recommendations.   Return in about 1 week (around 01/06/2024) for TCA treatment.   LOIS Yolanda Moats, MD, [facility] Attending Center for [employer] [facility])

## 2024-01-02 DIAGNOSIS — H9203 Otalgia, bilateral: Secondary | ICD-10-CM | POA: Insufficient documentation

## 2024-01-02 DIAGNOSIS — J0120 Acute ethmoidal sinusitis, unspecified: Secondary | ICD-10-CM | POA: Insufficient documentation

## 2024-01-02 DIAGNOSIS — E66812 Obesity, class 2: Secondary | ICD-10-CM | POA: Insufficient documentation

## 2024-01-02 DIAGNOSIS — I10 Essential (primary) hypertension: Secondary | ICD-10-CM | POA: Insufficient documentation

## 2024-01-02 NOTE — Assessment & Plan Note (Signed)
 Routine adult wellness visit with discussion on lifestyle modifications. - Encouraged monthly self-breast exams.

## 2024-01-02 NOTE — Assessment & Plan Note (Signed)
 Discussed dietary habits and physical activity. - Encouraged dietary modifications and increased physical activity. - Discussed potential benefits of weight loss on blood pressure and overall health.

## 2024-01-02 NOTE — Assessment & Plan Note (Signed)
 Hypertension with readings above 130/80 mmHg. Discussed risks of uncontrolled hypertension and potential side effects of amlodipine . Emphasized lifestyle modifications. - Started amlodipine  2.5 mg daily. - Ordered baseline labs. - Scheduled nurse visit for blood pressure check in 2 weeks. - Scheduled follow-up appointment in 8-10 weeks to assess medication efficacy and side effects. - Encouraged lifestyle modifications including reducing salt intake, increasing water consumption, and regular exercise.

## 2024-01-02 NOTE — Assessment & Plan Note (Signed)
 Since wisdom teeth extraction. Left ear canal slightly red. No significant pain reported.

## 2024-01-02 NOTE — Assessment & Plan Note (Signed)
 Symptoms of sinus congestion and stuffiness. No significant pain reported. - Prescribed Augmentin . - Continue using Flonase  and Azelastine  nasal sprays.

## 2024-01-02 NOTE — Assessment & Plan Note (Signed)
 Continues to smoke approximately 10 cigarettes per day. Discussed impact on blood pressure and health. - Encouraged smoking cessation.

## 2024-01-07 ENCOUNTER — Ambulatory Visit: Admitting: Advanced Practice Midwife

## 2024-01-07 ENCOUNTER — Ambulatory Visit: Admitting: Podiatry

## 2024-01-07 ENCOUNTER — Ambulatory Visit

## 2024-01-07 DIAGNOSIS — M216X1 Other acquired deformities of right foot: Secondary | ICD-10-CM | POA: Diagnosis not present

## 2024-01-07 DIAGNOSIS — M7752 Other enthesopathy of left foot: Secondary | ICD-10-CM | POA: Diagnosis not present

## 2024-01-07 DIAGNOSIS — Z419 Encounter for procedure for purposes other than remedying health state, unspecified: Secondary | ICD-10-CM | POA: Diagnosis not present

## 2024-01-07 DIAGNOSIS — M216X2 Other acquired deformities of left foot: Secondary | ICD-10-CM | POA: Diagnosis not present

## 2024-01-07 NOTE — Progress Notes (Signed)
 Subjective:  Patient ID: Alicia Perry, female    DOB: Apr 17, 1980,  MRN: [id number]  Chief Complaint  Patient presents with   Toe Pain    43 y.o. female presents with the above complaint.  Patient presents with complaint of left hallux interphalangeal joint capsulitis painful to touch is progressing and worse worse with ambulation or shoe pressure she states that would like to discuss treatment options for her.  She has not tried and therefore she has secondary complaint of orthotics/flatfoot deformity.  She states she does not wear any orthotics.  She would like to discuss treatment options for that as well.   Review of Systems: Negative except as noted in the HPI. Denies N/V/F/Ch.  Past Medical History:  Diagnosis Date   No pertinent past medical history    Current Medications[1]  Tobacco Use History[2]  Allergies[3] Objective:  There were no vitals filed for this visit. There is no height or weight on file to calculate BMI. Constitutional Well developed. Well nourished.  Vascular Dorsalis pedis pulses palpable bilaterally. Posterior tibial pulses palpable bilaterally. Capillary refill normal to all digits.  No cyanosis or clubbing noted. Pedal hair growth normal.  Neurologic Normal speech. Oriented to person, place, and time. Epicritic sensation to light touch grossly present bilaterally.  Dermatologic Nails well groomed and normal in appearance. No open wounds. No skin lesions.  Orthopedic: Left pain on palpation to the hallux interphalangeal joint pain with range of motion of the joint mild crepitus noted clinically able to appreciate the arthritis.  No other extensor flexor tendinitis noted.  Pes planovalgus deformity noted   Radiographs: None Assessment:   1. Capsulitis of toe, left   2. Other acquired deformities of right foot   3. Other acquired deformities of left foot    Plan:  Patient was evaluated and treated and all questions answered.  Left hallux  interphalangeal joint capsulitis - All questions and concerns were discussed with the patient extensive due to given the amount of pain that she is having she would benefit from a steroid injection of decreasing inflammatory, surgical pain.  Patient agrees upon to proceed with steroid injection -A steroid injection was performed at left hallux interphalangeal joint capsulitis using 1% plain Lidocaine  and 10 mg of Kenalog. This was well tolerated.  -Pes planovalgus/foot deformity -I explained to patient the etiology of pes planovalgus and relationship with heel pain/arch pain and various treatment options were discussed.  Given patient foot structure in the setting of heel pain/arch pain I believe patient will benefit from custom-made orthotics to help control the hindfoot motion support the arch of the foot and take the stress away from arches.  Patient agrees with the plan like to proceed with orthotics -Patient was casted for orthotics    No follow-ups on file.     [1]  Current Outpatient Medications:    albuterol  (VENTOLIN  HFA) 108 (90 Base) MCG/ACT inhaler, Inhale 2 puffs into the lungs every 6 (six) hours as needed for wheezing or shortness of breath., Disp: 18 g, Rfl: 1   amLODipine  (NORVASC ) 2.5 MG tablet, Take 1 tablet (2.5 mg total) by mouth daily., Disp: 30 tablet, Rfl: 2   amoxicillin -clavulanate (AUGMENTIN ) 875-125 MG tablet, Take 1 tablet by mouth 2 (two) times daily. (Patient not taking: Reported on 01/11/2024), Disp: 14 tablet, Rfl: 0   azelastine  (ASTELIN ) 0.1 % nasal spray, Use in each nostril as directed (Patient not taking: Reported on 01/11/2024), Disp: 30 mL, Rfl: 1   Multiple Vitamin (MULTIVITAMIN  WITH MINERALS) TABS tablet, Take 1 tablet by mouth daily. (Patient not taking: Reported on 01/11/2024), Disp: , Rfl:  [2]  Social History Tobacco Use  Smoking Status Every Day   Current packs/day: 0.75   Average packs/day: 0.8 packs/day for 22.0 years (16.5 ttl pk-yrs)    Types: Cigarettes  Smokeless Tobacco Never  Tobacco Comments   12-21-23 - smoking 10 cigs/day, trying to quit  [3] No Known Allergies

## 2024-01-11 ENCOUNTER — Ambulatory Visit

## 2024-01-11 VITALS — BP 136/82 | HR 90 | Temp 98.1°F | Ht 63.0 in | Wt 210.0 lb

## 2024-01-11 DIAGNOSIS — I10 Essential (primary) hypertension: Secondary | ICD-10-CM

## 2024-01-11 NOTE — Progress Notes (Unsigned)
 Patient is in office today for a nurse visit for Blood Pressure Check. She reports only taking medication Amlodipine  2.5MG  for two days. Started Sunday night. Denies headache, chest pain & sob.

## 2024-01-12 ENCOUNTER — Ambulatory Visit: Admitting: Family Medicine

## 2024-01-14 ENCOUNTER — Ambulatory Visit

## 2024-02-11 ENCOUNTER — Telehealth: Payer: Self-pay | Admitting: Podiatry

## 2024-02-11 NOTE — Telephone Encounter (Signed)
 Spoke to [facility]; scheduled an appt on 02/17/24 at 3:15 p.m. to [facility] in the [place] office.

## 2024-02-14 NOTE — Progress Notes (Unsigned)
 LILLETTE Kristeen JINNY Gladis, CMA,acting as a neurosurgeon for Gaines Ada, FNP.,have documented all relevant documentation on the behalf of Gaines Ada, FNP,as directed by  Gaines Ada, FNP while in the presence of Gaines Ada, FNP.  Subjective:  Patient ID: Alicia Perry , female    DOB: 08-Nov-1980 , 44 y.o.   MRN: [id number]  No chief complaint on file.   HPI  HPI   Past Medical History:  Diagnosis Date   No pertinent past medical history      Family History  Problem Relation Age of Onset   Diabetes Father    Hypertension Father    Heart failure Father    Kidney disease Father    Breast cancer Neg Hx     Current Medications[1]   Allergies[2]   Review of Systems   There were no vitals filed for this visit. There is no height or weight on file to calculate BMI.  Wt Readings from Last 3 Encounters:  01/11/24 210 lb (95.3 kg)  12/30/23 210 lb 11.2 oz (95.6 kg)  12/21/23 215 lb (97.5 kg)    The 10-year ASCVD risk score (Arnett DK, et al., 2019) is: 3.8%   Values used to calculate the score:     Age: 38 years     Clinically relevant sex: Female     Is Non-Hispanic African American: Yes     Diabetic: No     Tobacco smoker: Yes     Systolic Blood Pressure: 136 mmHg     Is BP treated: Yes     HDL Cholesterol: 53 mg/dL     Total Cholesterol: 143 mg/dL  Objective:  Physical Exam      Assessment And Plan:   Assessment & Plan Essential hypertension   No orders of the defined types were placed in this encounter.    No follow-ups on file.  Patient was given opportunity to ask questions. Patient verbalized understanding of the plan and was able to repeat key elements of the plan. All questions were answered to their satisfaction.    LILLETTE Gaines Ada, FNP, have reviewed all documentation for this visit. The documentation on 02/14/24 for the exam, diagnosis, procedures, and orders are all accurate and complete.   IF YOU HAVE BEEN REFERRED TO A SPECIALIST, IT MAY TAKE 1-2  WEEKS TO SCHEDULE/PROCESS THE REFERRAL. IF YOU HAVE NOT HEARD FROM US /SPECIALIST IN TWO WEEKS, PLEASE GIVE US  A CALL AT [phone] X [phone].     [1]  Current Outpatient Medications:    albuterol  (VENTOLIN  HFA) 108 (90 Base) MCG/ACT inhaler, Inhale 2 puffs into the lungs every 6 (six) hours as needed for wheezing or shortness of breath., Disp: 18 g, Rfl: 1   amLODipine  (NORVASC ) 2.5 MG tablet, Take 1 tablet (2.5 mg total) by mouth daily., Disp: 30 tablet, Rfl: 2   amoxicillin -clavulanate (AUGMENTIN ) 875-125 MG tablet, Take 1 tablet by mouth 2 (two) times daily. (Patient not taking: Reported on 01/11/2024), Disp: 14 tablet, Rfl: 0   azelastine  (ASTELIN ) 0.1 % nasal spray, Use in each nostril as directed (Patient not taking: Reported on 01/11/2024), Disp: 30 mL, Rfl: 1   Multiple Vitamin (MULTIVITAMIN WITH MINERALS) TABS tablet, Take 1 tablet by mouth daily. (Patient not taking: Reported on 01/11/2024), Disp: , Rfl:  [2] No Known Allergies

## 2024-02-15 ENCOUNTER — Ambulatory Visit: Payer: Self-pay | Admitting: Nurse Practitioner

## 2024-02-15 DIAGNOSIS — I10 Essential (primary) hypertension: Secondary | ICD-10-CM

## 2024-02-17 ENCOUNTER — Encounter

## 2024-02-24 ENCOUNTER — Encounter

## 2024-12-25 ENCOUNTER — Encounter: Payer: Self-pay | Admitting: Nurse Practitioner
